# Patient Record
Sex: Female | Born: 1975 | Race: Black or African American | Hispanic: No | Marital: Single | Smoking: Former smoker
Health system: Southern US, Community
[De-identification: ages and names within clinical notes are randomized; demographics above are authoritative.]

## PROBLEM LIST (undated history)

## (undated) ENCOUNTER — Inpatient Hospital Stay (HOSPITAL_COMMUNITY): Payer: Self-pay

## (undated) DIAGNOSIS — I10 Essential (primary) hypertension: Secondary | ICD-10-CM

## (undated) DIAGNOSIS — R87629 Unspecified abnormal cytological findings in specimens from vagina: Secondary | ICD-10-CM

## (undated) DIAGNOSIS — M069 Rheumatoid arthritis, unspecified: Secondary | ICD-10-CM

## (undated) HISTORY — PX: DILATION AND CURETTAGE OF UTERUS: SHX78

## (undated) HISTORY — DX: Rheumatoid arthritis, unspecified: M06.9

## (undated) HISTORY — PX: CHOLECYSTECTOMY: SHX55

## (undated) HISTORY — PX: THERAPEUTIC ABORTION: SHX798

---

## 1997-06-19 ENCOUNTER — Inpatient Hospital Stay (HOSPITAL_COMMUNITY): Admission: AD | Admit: 1997-06-19 | Discharge: 1997-06-19 | Payer: Self-pay | Admitting: *Deleted

## 1997-06-25 ENCOUNTER — Inpatient Hospital Stay (HOSPITAL_COMMUNITY): Admission: AD | Admit: 1997-06-25 | Discharge: 1997-06-25 | Payer: Self-pay | Admitting: *Deleted

## 1997-07-21 ENCOUNTER — Inpatient Hospital Stay (HOSPITAL_COMMUNITY): Admission: AD | Admit: 1997-07-21 | Discharge: 1997-07-21 | Payer: Self-pay | Admitting: Obstetrics

## 1997-07-31 ENCOUNTER — Ambulatory Visit (HOSPITAL_COMMUNITY): Admission: RE | Admit: 1997-07-31 | Discharge: 1997-07-31 | Payer: Self-pay | Admitting: *Deleted

## 1997-09-04 ENCOUNTER — Inpatient Hospital Stay (HOSPITAL_COMMUNITY): Admission: AD | Admit: 1997-09-04 | Discharge: 1997-09-04 | Payer: Self-pay | Admitting: Obstetrics

## 1997-12-17 ENCOUNTER — Inpatient Hospital Stay (HOSPITAL_COMMUNITY): Admission: AD | Admit: 1997-12-17 | Discharge: 1997-12-17 | Payer: Self-pay | Admitting: *Deleted

## 1997-12-20 ENCOUNTER — Inpatient Hospital Stay (HOSPITAL_COMMUNITY): Admission: AD | Admit: 1997-12-20 | Discharge: 1997-12-22 | Payer: Self-pay | Admitting: Obstetrics

## 1998-07-16 ENCOUNTER — Inpatient Hospital Stay (HOSPITAL_COMMUNITY): Admission: AD | Admit: 1998-07-16 | Discharge: 1998-07-16 | Payer: Self-pay | Admitting: Obstetrics

## 1998-07-31 ENCOUNTER — Inpatient Hospital Stay (HOSPITAL_COMMUNITY): Admission: AD | Admit: 1998-07-31 | Discharge: 1998-07-31 | Payer: Self-pay | Admitting: Obstetrics

## 1998-10-21 ENCOUNTER — Other Ambulatory Visit: Admission: RE | Admit: 1998-10-21 | Discharge: 1998-10-21 | Payer: Self-pay | Admitting: Obstetrics & Gynecology

## 1998-10-21 ENCOUNTER — Other Ambulatory Visit: Admission: RE | Admit: 1998-10-21 | Discharge: 1998-10-21 | Payer: Self-pay | Admitting: Obstetrics

## 1998-10-21 ENCOUNTER — Encounter: Admission: RE | Admit: 1998-10-21 | Discharge: 1998-10-21 | Payer: Self-pay | Admitting: Obstetrics & Gynecology

## 1998-11-21 ENCOUNTER — Emergency Department (HOSPITAL_COMMUNITY): Admission: EM | Admit: 1998-11-21 | Discharge: 1998-11-21 | Payer: Self-pay | Admitting: Emergency Medicine

## 1999-01-02 ENCOUNTER — Encounter: Admission: RE | Admit: 1999-01-02 | Discharge: 1999-01-02 | Payer: Self-pay | Admitting: Obstetrics & Gynecology

## 1999-01-02 ENCOUNTER — Other Ambulatory Visit: Admission: RE | Admit: 1999-01-02 | Discharge: 1999-01-02 | Payer: Self-pay | Admitting: Obstetrics

## 1999-01-16 ENCOUNTER — Encounter: Admission: RE | Admit: 1999-01-16 | Discharge: 1999-01-16 | Payer: Self-pay | Admitting: Obstetrics & Gynecology

## 1999-03-07 ENCOUNTER — Inpatient Hospital Stay (HOSPITAL_COMMUNITY): Admission: AD | Admit: 1999-03-07 | Discharge: 1999-03-07 | Payer: Self-pay | Admitting: *Deleted

## 1999-03-09 ENCOUNTER — Inpatient Hospital Stay (HOSPITAL_COMMUNITY): Admission: AD | Admit: 1999-03-09 | Discharge: 1999-03-12 | Payer: Self-pay | Admitting: *Deleted

## 1999-03-09 ENCOUNTER — Encounter: Payer: Self-pay | Admitting: *Deleted

## 1999-03-10 ENCOUNTER — Encounter: Payer: Self-pay | Admitting: Obstetrics

## 1999-03-11 ENCOUNTER — Encounter: Payer: Self-pay | Admitting: *Deleted

## 1999-03-24 ENCOUNTER — Encounter: Admission: RE | Admit: 1999-03-24 | Discharge: 1999-03-24 | Payer: Self-pay | Admitting: Obstetrics & Gynecology

## 1999-08-27 ENCOUNTER — Inpatient Hospital Stay (HOSPITAL_COMMUNITY): Admission: AD | Admit: 1999-08-27 | Discharge: 1999-08-27 | Payer: Self-pay | Admitting: Obstetrics & Gynecology

## 2000-02-02 ENCOUNTER — Ambulatory Visit (HOSPITAL_COMMUNITY): Admission: RE | Admit: 2000-02-02 | Discharge: 2000-02-02 | Payer: Self-pay

## 2000-02-02 ENCOUNTER — Encounter: Admission: RE | Admit: 2000-02-02 | Discharge: 2000-02-02 | Payer: Self-pay

## 2000-03-28 ENCOUNTER — Emergency Department (HOSPITAL_COMMUNITY): Admission: EM | Admit: 2000-03-28 | Discharge: 2000-03-28 | Payer: Self-pay | Admitting: *Deleted

## 2000-04-09 ENCOUNTER — Inpatient Hospital Stay (HOSPITAL_COMMUNITY): Admission: AD | Admit: 2000-04-09 | Discharge: 2000-04-09 | Payer: Self-pay | Admitting: *Deleted

## 2000-04-19 ENCOUNTER — Encounter: Admission: RE | Admit: 2000-04-19 | Discharge: 2000-04-19 | Payer: Self-pay | Admitting: Obstetrics & Gynecology

## 2000-05-22 ENCOUNTER — Emergency Department (HOSPITAL_COMMUNITY): Admission: EM | Admit: 2000-05-22 | Discharge: 2000-05-22 | Payer: Self-pay | Admitting: *Deleted

## 2000-08-24 ENCOUNTER — Emergency Department (HOSPITAL_COMMUNITY): Admission: EM | Admit: 2000-08-24 | Discharge: 2000-08-24 | Payer: Self-pay | Admitting: Emergency Medicine

## 2000-08-25 ENCOUNTER — Inpatient Hospital Stay (HOSPITAL_COMMUNITY): Admission: AD | Admit: 2000-08-25 | Discharge: 2000-08-25 | Payer: Self-pay | Admitting: Obstetrics & Gynecology

## 2000-08-25 ENCOUNTER — Encounter: Payer: Self-pay | Admitting: Obstetrics & Gynecology

## 2000-09-21 ENCOUNTER — Emergency Department (HOSPITAL_COMMUNITY): Admission: EM | Admit: 2000-09-21 | Discharge: 2000-09-21 | Payer: Self-pay | Admitting: Emergency Medicine

## 2000-09-21 ENCOUNTER — Encounter: Payer: Self-pay | Admitting: Emergency Medicine

## 2001-01-31 ENCOUNTER — Inpatient Hospital Stay (HOSPITAL_COMMUNITY): Admission: AD | Admit: 2001-01-31 | Discharge: 2001-01-31 | Payer: Self-pay | Admitting: *Deleted

## 2001-01-31 ENCOUNTER — Encounter: Payer: Self-pay | Admitting: Obstetrics

## 2001-02-03 ENCOUNTER — Observation Stay (HOSPITAL_COMMUNITY): Admission: AD | Admit: 2001-02-03 | Discharge: 2001-02-03 | Payer: Self-pay | Admitting: Obstetrics

## 2001-02-03 ENCOUNTER — Encounter: Payer: Self-pay | Admitting: Obstetrics

## 2001-04-04 ENCOUNTER — Inpatient Hospital Stay (HOSPITAL_COMMUNITY): Admission: AD | Admit: 2001-04-04 | Discharge: 2001-04-05 | Payer: Self-pay | Admitting: Obstetrics

## 2001-04-22 ENCOUNTER — Emergency Department (HOSPITAL_COMMUNITY): Admission: EM | Admit: 2001-04-22 | Discharge: 2001-04-22 | Payer: Self-pay | Admitting: Emergency Medicine

## 2001-12-11 ENCOUNTER — Emergency Department (HOSPITAL_COMMUNITY): Admission: EM | Admit: 2001-12-11 | Discharge: 2001-12-11 | Payer: Self-pay | Admitting: Emergency Medicine

## 2002-01-09 ENCOUNTER — Inpatient Hospital Stay (HOSPITAL_COMMUNITY): Admission: AD | Admit: 2002-01-09 | Discharge: 2002-01-09 | Payer: Self-pay | Admitting: Obstetrics

## 2002-01-09 ENCOUNTER — Encounter: Payer: Self-pay | Admitting: Obstetrics

## 2002-04-03 ENCOUNTER — Inpatient Hospital Stay (HOSPITAL_COMMUNITY): Admission: AD | Admit: 2002-04-03 | Discharge: 2002-04-03 | Payer: Self-pay | Admitting: Obstetrics

## 2002-07-09 ENCOUNTER — Inpatient Hospital Stay (HOSPITAL_COMMUNITY): Admission: AD | Admit: 2002-07-09 | Discharge: 2002-07-09 | Payer: Self-pay | Admitting: Obstetrics

## 2002-12-24 ENCOUNTER — Inpatient Hospital Stay (HOSPITAL_COMMUNITY): Admission: AD | Admit: 2002-12-24 | Discharge: 2002-12-24 | Payer: Self-pay | Admitting: Obstetrics

## 2003-03-16 ENCOUNTER — Inpatient Hospital Stay (HOSPITAL_COMMUNITY): Admission: AD | Admit: 2003-03-16 | Discharge: 2003-03-16 | Payer: Self-pay | Admitting: Obstetrics

## 2003-04-12 ENCOUNTER — Emergency Department (HOSPITAL_COMMUNITY): Admission: AD | Admit: 2003-04-12 | Discharge: 2003-04-12 | Payer: Self-pay | Admitting: Family Medicine

## 2003-06-22 ENCOUNTER — Emergency Department (HOSPITAL_COMMUNITY): Admission: AD | Admit: 2003-06-22 | Discharge: 2003-06-22 | Payer: Self-pay | Admitting: Internal Medicine

## 2003-11-27 ENCOUNTER — Inpatient Hospital Stay (HOSPITAL_COMMUNITY): Admission: AD | Admit: 2003-11-27 | Discharge: 2003-11-27 | Payer: Self-pay | Admitting: Obstetrics

## 2003-11-28 ENCOUNTER — Inpatient Hospital Stay (HOSPITAL_COMMUNITY): Admission: RE | Admit: 2003-11-28 | Discharge: 2003-11-28 | Payer: Self-pay | Admitting: Obstetrics

## 2004-01-22 ENCOUNTER — Encounter (INDEPENDENT_AMBULATORY_CARE_PROVIDER_SITE_OTHER): Payer: Self-pay | Admitting: Specialist

## 2004-01-22 ENCOUNTER — Ambulatory Visit (HOSPITAL_COMMUNITY): Admission: RE | Admit: 2004-01-22 | Discharge: 2004-01-23 | Payer: Self-pay | Admitting: Surgery

## 2004-06-15 ENCOUNTER — Emergency Department (HOSPITAL_COMMUNITY): Admission: EM | Admit: 2004-06-15 | Discharge: 2004-06-15 | Payer: Self-pay | Admitting: Family Medicine

## 2004-08-09 ENCOUNTER — Emergency Department (HOSPITAL_COMMUNITY): Admission: EM | Admit: 2004-08-09 | Discharge: 2004-08-09 | Payer: Self-pay | Admitting: Emergency Medicine

## 2004-09-29 ENCOUNTER — Ambulatory Visit: Payer: Self-pay | Admitting: Internal Medicine

## 2004-10-24 ENCOUNTER — Emergency Department (HOSPITAL_COMMUNITY): Admission: EM | Admit: 2004-10-24 | Discharge: 2004-10-24 | Payer: Self-pay | Admitting: Family Medicine

## 2004-11-30 ENCOUNTER — Inpatient Hospital Stay (HOSPITAL_COMMUNITY): Admission: AD | Admit: 2004-11-30 | Discharge: 2004-11-30 | Payer: Self-pay | Admitting: Obstetrics

## 2005-02-22 ENCOUNTER — Emergency Department (HOSPITAL_COMMUNITY): Admission: EM | Admit: 2005-02-22 | Discharge: 2005-02-22 | Payer: Self-pay | Admitting: Family Medicine

## 2007-06-30 ENCOUNTER — Ambulatory Visit (HOSPITAL_COMMUNITY): Admission: RE | Admit: 2007-06-30 | Discharge: 2007-06-30 | Payer: Self-pay | Admitting: Obstetrics

## 2007-07-26 ENCOUNTER — Inpatient Hospital Stay (HOSPITAL_COMMUNITY): Admission: AD | Admit: 2007-07-26 | Discharge: 2007-07-26 | Payer: Self-pay | Admitting: Obstetrics

## 2007-08-10 ENCOUNTER — Ambulatory Visit (HOSPITAL_COMMUNITY): Admission: RE | Admit: 2007-08-10 | Discharge: 2007-08-10 | Payer: Self-pay | Admitting: Obstetrics

## 2007-11-19 ENCOUNTER — Inpatient Hospital Stay (HOSPITAL_COMMUNITY): Admission: AD | Admit: 2007-11-19 | Discharge: 2007-11-19 | Payer: Self-pay | Admitting: Obstetrics

## 2007-12-17 ENCOUNTER — Inpatient Hospital Stay (HOSPITAL_COMMUNITY): Admission: AD | Admit: 2007-12-17 | Discharge: 2007-12-19 | Payer: Self-pay | Admitting: Obstetrics

## 2008-05-16 ENCOUNTER — Emergency Department (HOSPITAL_COMMUNITY): Admission: EM | Admit: 2008-05-16 | Discharge: 2008-05-16 | Payer: Self-pay | Admitting: Family Medicine

## 2008-12-28 IMAGING — US US OB DETAIL+14 WK
1 series · 14 of 28 positions shown · non-contrast
Comparison: none

OBSTETRICAL ULTRASOUND:
 This ultrasound was performed in The [HOSPITAL], and the [facility] report will be stored to [REDACTED] PACS.

[Series 1: us ob detail+14 wk · 14 of 77 slices shown]
[im 3/77]
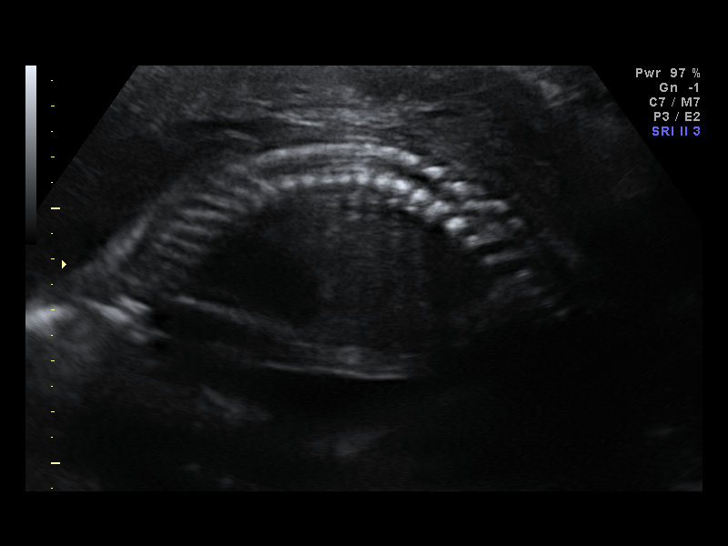
[im 9/77]
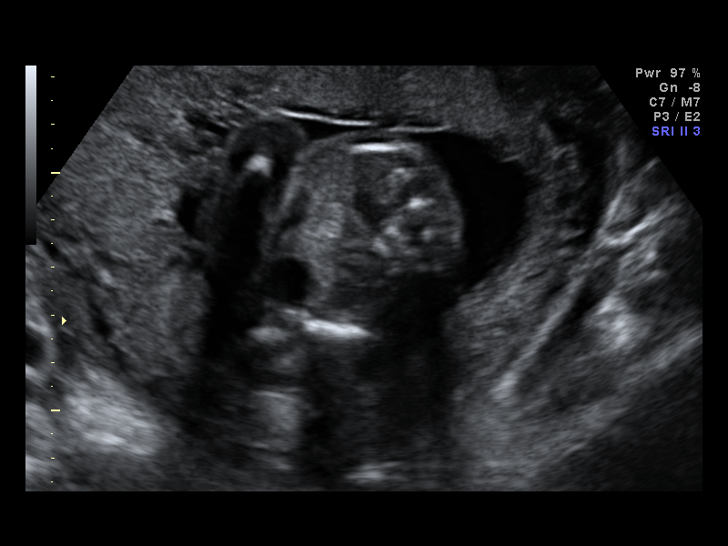
[im 15/77]
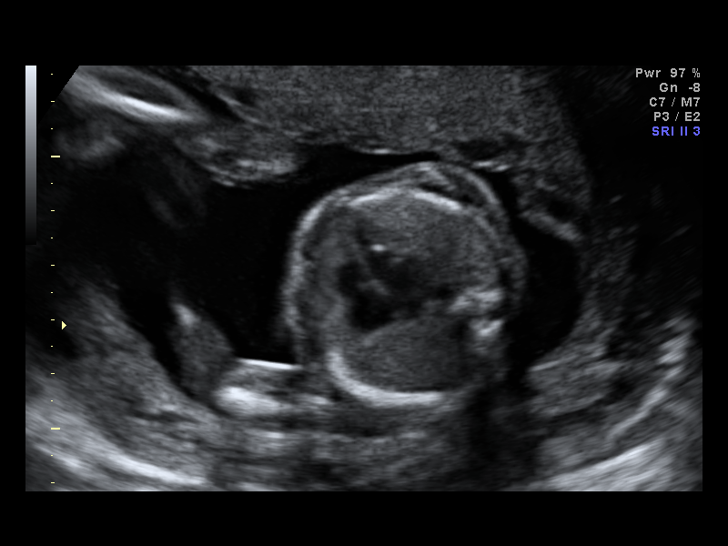
[im 20/77]
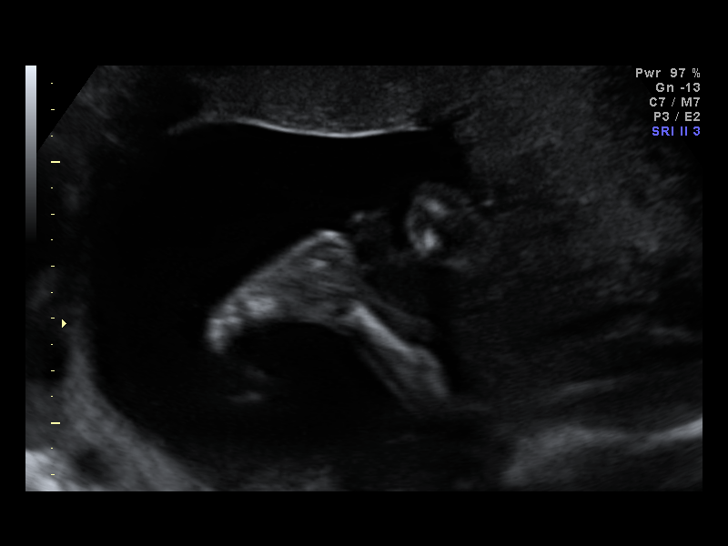
[im 26/77]
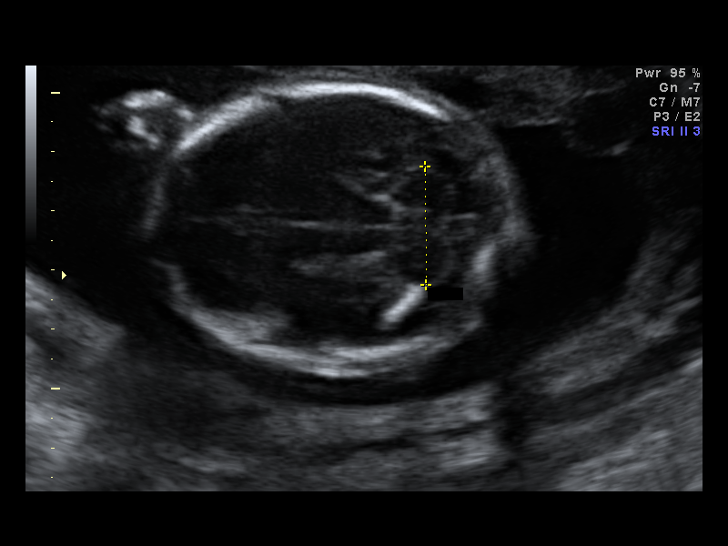
[im 31/77]
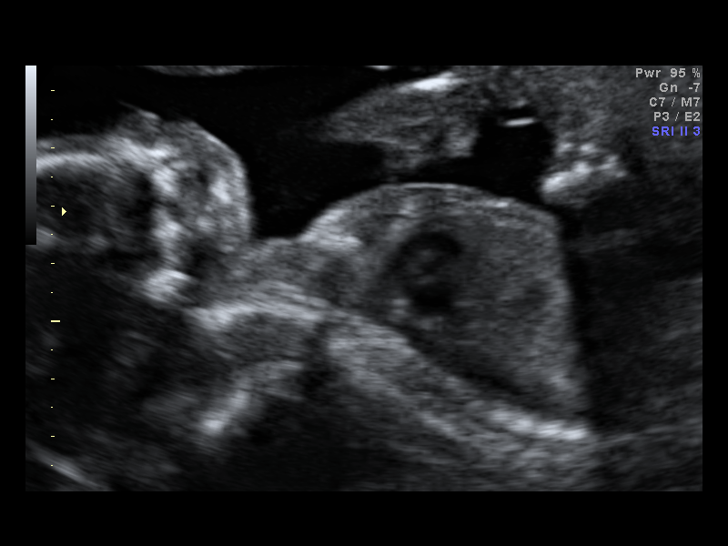
[im 37/77]
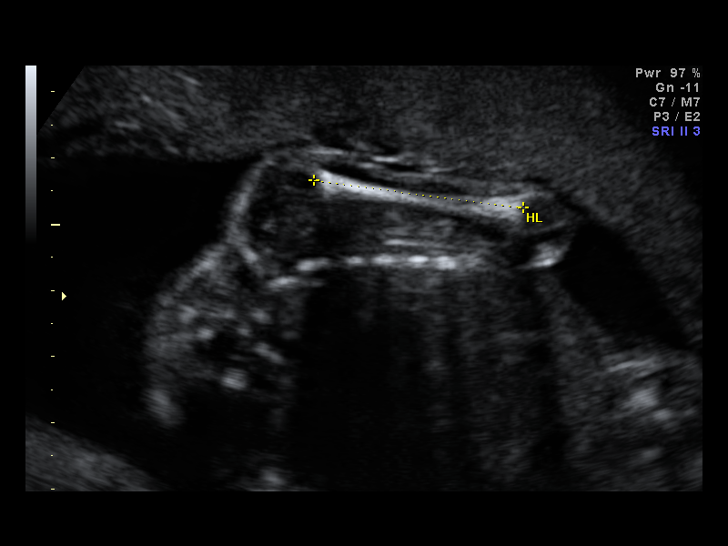
[im 43/77]
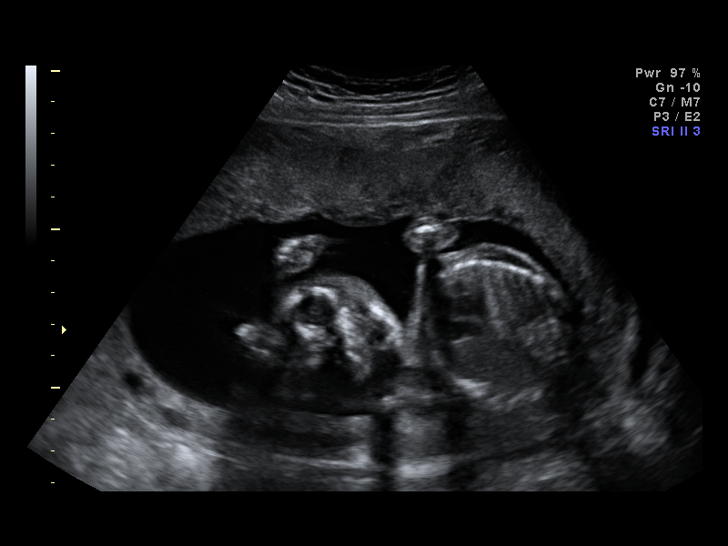
[im 48/77]
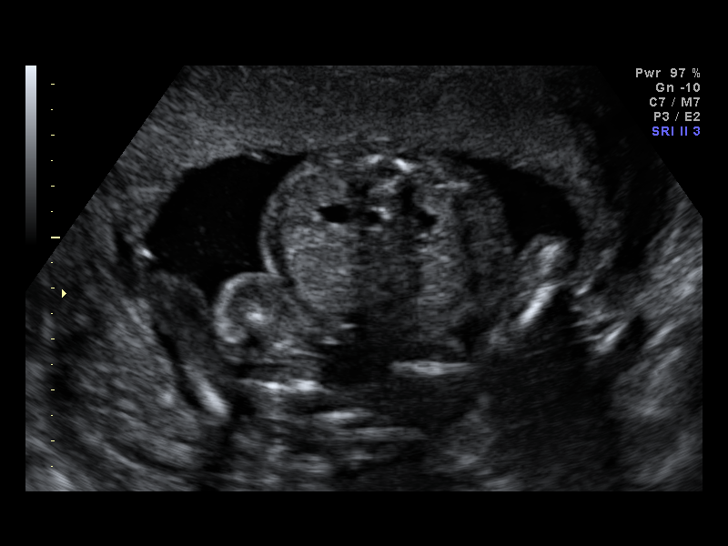
[im 54/77]
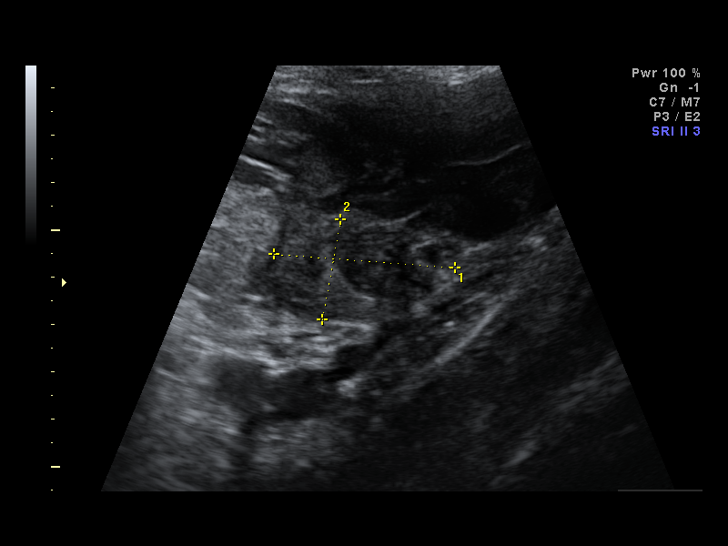
[im 60/77]
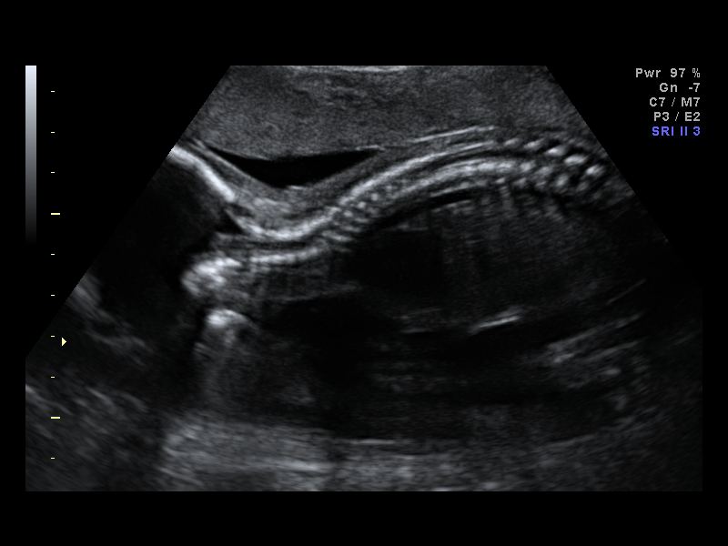
[im 65/77]
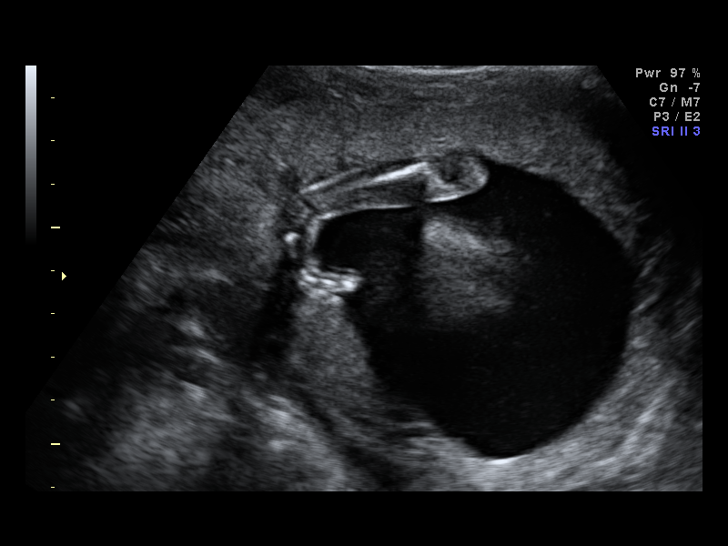
[im 71/77]
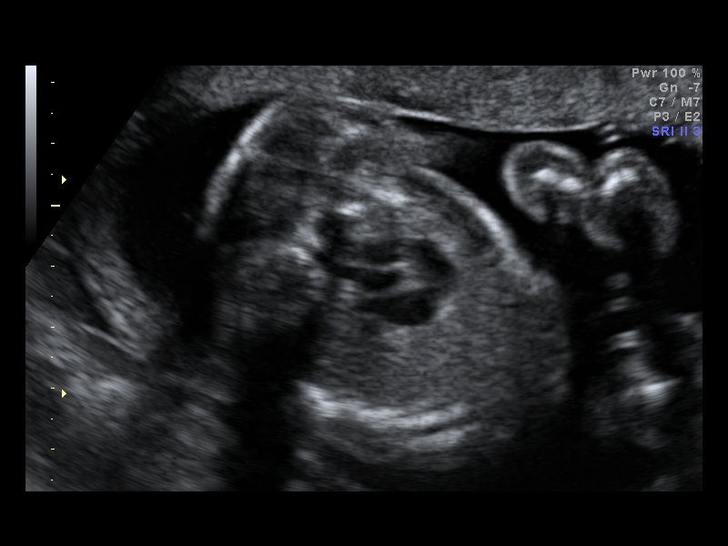
[im 77/77]
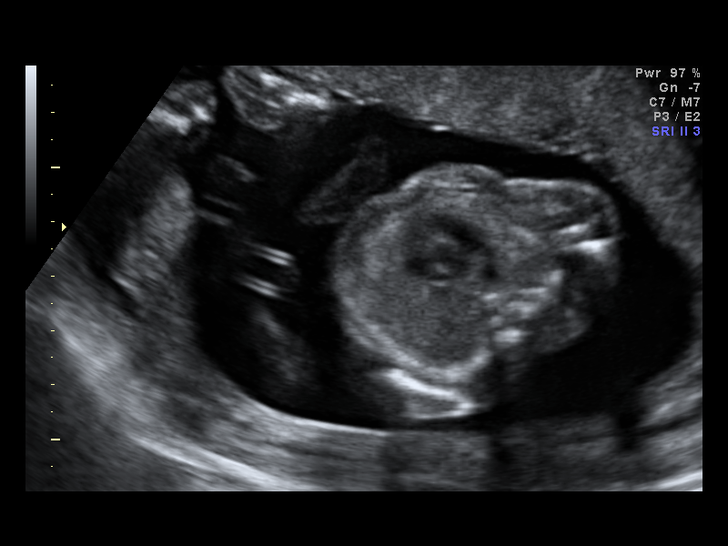

[14 of 28 positions shown; findings below may reference images not displayed]

IMPRESSION: [facility] has also been faxed to the ordering physician.

## 2009-02-25 ENCOUNTER — Emergency Department (HOSPITAL_COMMUNITY): Admission: EM | Admit: 2009-02-25 | Discharge: 2009-02-25 | Payer: Self-pay | Admitting: Emergency Medicine

## 2009-06-03 ENCOUNTER — Emergency Department (HOSPITAL_COMMUNITY): Admission: EM | Admit: 2009-06-03 | Discharge: 2009-06-04 | Payer: Self-pay | Admitting: Emergency Medicine

## 2009-07-15 ENCOUNTER — Emergency Department (HOSPITAL_COMMUNITY): Admission: EM | Admit: 2009-07-15 | Discharge: 2009-07-16 | Payer: Self-pay | Admitting: Emergency Medicine

## 2010-02-09 ENCOUNTER — Emergency Department (HOSPITAL_COMMUNITY): Admission: EM | Admit: 2010-02-09 | Discharge: 2010-02-09 | Payer: Self-pay | Admitting: Family Medicine

## 2010-05-04 ENCOUNTER — Encounter: Payer: Self-pay | Admitting: Obstetrics

## 2010-08-25 NOTE — H&P (Signed)
NAME:  Jill Bright, Jill Bright NO.:  [id number]   MEDICAL RECORD NO.:  [id number]          PATIENT TYPE:  INP   LOCATION:  9163                          FACILITY:  [facility]   PHYSICIAN:  Roseanna Rainbow, M.D.DATE OF BIRTH:  1976-01-16   DATE OF ADMISSION:  12/17/2007  DATE OF DISCHARGE:                              HISTORY & PHYSICAL   CHIEF COMPLAINT:  The patient is a 35 year old para 2 with an estimated  date of confinement of December 20, 2007 with an intrauterine pregnancy  at 39 plus weeks, complaining of contractions and bloody show.   HISTORY OF PRESENT ILLNESS:  Please see the above.   ALLERGIES:  No known drug allergies.   SOCIAL HISTORY:  She is single.  She denies any tobacco, ethanol or drug  use.   PAST GYN HISTORY:  Normal triad.  There is also a history of gonorrhea.   PAST OBSTETRICAL HISTORY:  In 1999, she has delivered of a live born  female, 7 pounds 4 ounces, spontaneous vaginal delivery and in 2002, she  has delivered of a live born female, 7 pounds 11 ounces, full term,  vaginal delivery.   PAST MEDICAL HISTORY:  She denies past surgical history.   She denies family history noncontributory.   PRENATAL LABORATORY DATA:  Hemoglobin 11.4, hematocrit 34, and platelets  206,000.  Blood type O positive and antibody screen negative.  Sickle  cell negative.  RPR nonreactive, rubella immune, hepatitis B surface  antigen negative, HIV nonreactive, and GC negative.  GBS negative on  November 23, 2007.  Antepartum course remarkable for treatment for urinary  tract infections and threatened preterm labor.   REVIEW OF SYSTEMS:  GU:  Please see the above.   PHYSICAL EXAMINATION:  Vital signs stable and afebrile.  Fetal heart  tracing reassuring.  Tocodynamometer, contractions every 2-4 minutes.  Sterile vaginal exam per the RN.   ASSESSMENT:  1. Multipara, at term.  2. Early labor.  3. Fetal heart tracing consistent with fetal well-being.   PLAN:   Admission, expectant management.       Roseanna Rainbow, M.D.  Electronically Signed     LAJ/MEDQ  D:  12/18/2007  T:  12/18/2007  Job:  [id number]   cc:   Kathreen Cosier, M.D.  Fax: [phone]

## 2010-08-28 NOTE — Op Note (Signed)
NAME:  Jill Bright, Jill Bright NO.:  [id number]   MEDICAL RECORD NO.:  [id number]          PATIENT TYPE:  OIB   LOCATION:  NA                           FACILITY:  [facility]   PHYSICIAN:  Thornton Park. Daphine Deutscher, M.D.DATE OF BIRTH:  [birth year]   DATE OF PROCEDURE:  01/22/2004  DATE OF DISCHARGE:                                 OPERATIVE REPORT   REFERRING PHYSICIAN:  Kathreen Cosier, M.D.   PREOPERATIVE DIAGNOSIS:  Chronic cholecystitis.   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   PROCEDURE:  Laparoscopic cholecystectomy.   SURGEON:  Thornton Park. Daphine Deutscher, M.D.   ASSISTANT:  Vikki Ports, M.D.   ANESTHESIA:  General endotracheal anesthesia.   DESCRIPTION OF PROCEDURE:  Jill Bright is a 35 year old black female  taken to room 16 on January 22, 2004, and given general anesthesia.  The  abdomen was prepped with Betadine and draped sterilely.  A longitudinal  incision was made down into the umbilicus and through this I entered the  abdomen and placed the applied medical Hasson.  The abdomen was insufflated,  found a vale of thin liquid adhesions in the upper abdomen occluding the  liver.  The liver had some vague spotty areas on the serosa.  It all  appeared to be serosal and inflammatory changes consistent with Curtis-Fitz-  Hugh syndrome.  These were visualized in the chart.  These were stripped  away revealing chronically inflamed gallbladder.  This was brought out and  contained multiple stones and the Calot's triangle was dissected free and  the critical view achieved.  Cystic duct was clamped and opened and a  Reddick catheter was inserted and a dynamic cholangiogram obtained which  showed good filling of the intrahepatic radicles and free-flowing of the  duodenum.  Cystic duct was triple clipped and divided.  Cystic arteries were  clipped and divided and the gallbladder was removed from the  gallbladder bed  without difficulty.  Hemostasis was achieved and no bowel leaks were noted.  It was placed in a bag and brought out through the umbilicus.  Umbilical  defect was  repaired with interrupted 0 Vicryls.  This was done with laparoscopic  vision.  The abdomen was deflated and the port sites were closed with 4-0  Vicryl.  The patient seemed to tolerate the procedure well and was taken to  the recovery room in satisfactory condition.       MBM/MEDQ  D:  01/22/2004  T:  01/22/2004  Job:  [id number]   cc:   Kathreen Cosier, M.D.  [address]  [place]  [place] [address]  Fax: [phone]

## 2011-01-05 LAB — URINALYSIS, ROUTINE W REFLEX MICROSCOPIC
Glucose, UA: NEGATIVE
Hgb urine dipstick: NEGATIVE
Ketones, ur: NEGATIVE
Protein, ur: NEGATIVE

## 2011-01-08 LAB — URINALYSIS, ROUTINE W REFLEX MICROSCOPIC
Bilirubin Urine: NEGATIVE
Glucose, UA: NEGATIVE
Ketones, ur: NEGATIVE
Nitrite: POSITIVE — AB
Specific Gravity, Urine: 1.015
pH: 6.5

## 2011-01-08 LAB — URINE MICROSCOPIC-ADD ON

## 2011-01-13 LAB — CBC
HCT: 29.1 — ABNORMAL LOW
Hemoglobin: 10.2 — ABNORMAL LOW
MCHC: 32.6
MCHC: 33.1
MCV: 94.3
RBC: 3.29 — ABNORMAL LOW
WBC: 10.7 — ABNORMAL HIGH

## 2012-01-30 ENCOUNTER — Inpatient Hospital Stay (HOSPITAL_COMMUNITY)
Admission: AD | Admit: 2012-01-30 | Discharge: 2012-01-30 | Disposition: A | Discharge status: Home / Self Care | Payer: Self-pay | Source: Ambulatory Visit | Attending: Obstetrics | Admitting: Obstetrics

## 2012-01-30 ENCOUNTER — Encounter (HOSPITAL_COMMUNITY): Payer: Self-pay | Admitting: *Deleted

## 2012-01-30 ENCOUNTER — Inpatient Hospital Stay (HOSPITAL_COMMUNITY): Payer: Self-pay

## 2012-01-30 DIAGNOSIS — Z3490 Encounter for supervision of normal pregnancy, unspecified, unspecified trimester: Secondary | ICD-10-CM

## 2012-01-30 DIAGNOSIS — R110 Nausea: Secondary | ICD-10-CM | POA: Insufficient documentation

## 2012-01-30 DIAGNOSIS — N831 Corpus luteum cyst of ovary, unspecified side: Secondary | ICD-10-CM | POA: Diagnosis present

## 2012-01-30 DIAGNOSIS — R51 Headache: Secondary | ICD-10-CM | POA: Insufficient documentation

## 2012-01-30 DIAGNOSIS — R1031 Right lower quadrant pain: Secondary | ICD-10-CM | POA: Insufficient documentation

## 2012-01-30 DIAGNOSIS — Z1389 Encounter for screening for other disorder: Secondary | ICD-10-CM

## 2012-01-30 DIAGNOSIS — B9689 Other specified bacterial agents as the cause of diseases classified elsewhere: Secondary | ICD-10-CM

## 2012-01-30 LAB — URINALYSIS, ROUTINE W REFLEX MICROSCOPIC
Glucose, UA: NEGATIVE mg/dL
Ketones, ur: 15 mg/dL — AB
Protein, ur: 100 mg/dL — AB
pH: 5.5 (ref 5.0–8.0)

## 2012-01-30 LAB — POCT PREGNANCY, URINE: Preg Test, Ur: POSITIVE — AB

## 2012-01-30 LAB — CBC
HCT: 33.1 % — ABNORMAL LOW (ref 36.0–46.0)
MCV: 88.7 fL (ref 78.0–100.0)
RBC: 3.73 MIL/uL — ABNORMAL LOW (ref 3.87–5.11)
WBC: 13.7 10*3/uL — ABNORMAL HIGH (ref 4.0–10.5)

## 2012-01-30 LAB — URINE MICROSCOPIC-ADD ON

## 2012-01-30 LAB — WET PREP, GENITAL: Yeast Wet Prep HPF POC: NONE SEEN

## 2012-01-30 LAB — ABO/RH: ABO/RH(D): O POS

## 2012-01-30 MED ORDER — OXYCODONE-ACETAMINOPHEN 5-325 MG PO TABS: 2.0000 | ORAL_TABLET | ORAL | Status: DC

## 2012-01-30 MED ORDER — ONDANSETRON 8 MG PO TBDP: 8.0000 mg | ORAL_TABLET | ORAL | Status: DC

## 2012-01-30 MED ORDER — ONDANSETRON 8 MG PO TBDP
8.0000 mg | ORAL_TABLET | ORAL | Status: AC
  Administered 2012-01-30: 8 mg via ORAL
  Filled 2012-01-30: qty 1

## 2012-01-30 MED ORDER — OXYCODONE-ACETAMINOPHEN 5-325 MG PO TABS
2.0000 | ORAL_TABLET | ORAL | Status: AC
  Administered 2012-01-30: 2 via ORAL
  Filled 2012-01-30: qty 2

## 2012-01-30 MED ORDER — METRONIDAZOLE 500 MG PO TABS: 500.0000 mg | ORAL_TABLET | Freq: Two times a day (BID) | ORAL | Status: DC

## 2012-01-30 NOTE — MAU Provider Note (Signed)
Chief Complaint: Abdominal Pain   First Provider Initiated Contact with Patient 01/30/12 2110     SUBJECTIVE HPI: Jill Bright is a 36 y.o. [id number] at [redacted]w[redacted]d by LMP who presents to maternity admissions reporting RLQ pain x3 days and n/v x2 days.  She rates her abdominal pain 10/10 and reports it has worsened today.  Patient's last menstrual period was 12/23/2011.  She denies vaginal bleeding, vaginal itching/burning, urinary symptoms, h/a, dizziness, or fever/chills.    After informing pt her pregnancy test is positive, she reports pregnancy is not desired and if it is normal pregnancy she plans to terminate.  History reviewed. No pertinent past medical history. Past Surgical History  Procedure Date  . Cholecystectomy    History   Social History  . Marital Status: Single    Spouse Name: N/A    Number of Children: N/A  . Years of Education: N/A   Occupational History  . Not on file.   Social History Main Topics  . Smoking status: Light Tobacco Smoker  . Smokeless tobacco: Not on file  . Alcohol Use: No  . Drug Use: No  . Sexually Active: Yes    Birth Control/ Protection: Condom   Other Topics Concern  . Not on file   Social History Narrative  . No narrative on file   No current facility-administered medications on file prior to encounter.   No current outpatient prescriptions on file prior to encounter.   Allergies  Allergen Reactions  . Tea Hives    ROS: Pertinent items in HPI  OBJECTIVE Blood pressure 109/69, pulse 103, temperature 98.3 F (36.8 C), temperature source Oral, resp. rate 18, height 5\' 6"  (1.676 m), weight 79.833 kg (176 lb), last menstrual period 12/23/2011, SpO2 100.00%. GENERAL: Well-developed, well-nourished female in no acute distress.  HEENT: Normocephalic HEART: normal rate RESP: normal effort ABDOMEN: Soft, non-tender EXTREMITIES: Nontender, no edema NEURO: Alert and oriented Pelvic exam: Cervix pink, visually closed, without  several small white patches 0.25cm in size, moderate amount white creamy discharge, vaginal walls and external genitalia normal Bimanual exam: Cervix 0/long/high, firm, posterior, neg CMT, uterus nontender, nonenlarged, adnexa with mild tenderness on right, none on left, no enlargement or mass bilaterally  LAB RESULTS Results for orders placed during the hospital encounter of 01/30/12 (from the past 24 hour(s))  URINALYSIS, ROUTINE W REFLEX MICROSCOPIC     Status: Abnormal   Collection Time   01/30/12  8:44 PM      Component Value Range   Color, Urine ORANGE (*) YELLOW   APPearance CLEAR  CLEAR   Specific Gravity, Urine 1.020  1.005 - 1.030   pH 5.5  5.0 - 8.0   Glucose, UA NEGATIVE  NEGATIVE mg/dL   Hgb urine dipstick TRACE (*) NEGATIVE   Bilirubin Urine MODERATE (*) NEGATIVE   Ketones, ur 15 (*) NEGATIVE mg/dL   Protein, ur 147 (*) NEGATIVE mg/dL   Urobilinogen, UA 2.0 (*) 0.0 - 1.0 mg/dL   Nitrite NEGATIVE  NEGATIVE   Leukocytes, UA TRACE (*) NEGATIVE  URINE MICROSCOPIC-ADD ON     Status: Abnormal   Collection Time   01/30/12  8:44 PM      Component Value Range   Squamous Epithelial / LPF FEW (*) RARE   WBC, UA 7-10  <3 WBC/hpf   RBC / HPF 7-10  <3 RBC/hpf   Bacteria, UA FEW (*) RARE  POCT PREGNANCY, URINE     Status: Abnormal   Collection Time   01/30/12  8:50 PM      Component Value Range   Preg Test, Ur POSITIVE (*) NEGATIVE  CBC     Status: Abnormal   Collection Time   01/30/12  9:09 PM      Component Value Range   WBC 13.7 (*) 4.0 - 10.5 K/uL   RBC 3.73 (*) 3.87 - 5.11 MIL/uL   Hemoglobin 11.0 (*) 12.0 - 15.0 g/dL   HCT 95.6 (*) 21.3 - 08.6 %   MCV 88.7  78.0 - 100.0 fL   MCH 29.5  26.0 - 34.0 pg   MCHC 33.2  30.0 - 36.0 g/dL   RDW 57.8  46.9 - 62.9 %   Platelets 157  150 - 400 K/uL  ABO/RH     Status: Normal   Collection Time   01/30/12  9:09 PM      Component Value Range   ABO/RH(D) O POS    HCG, QUANTITATIVE, PREGNANCY     Status: Abnormal   Collection  Time   01/30/12  9:09 PM      Component Value Range   hCG, Beta Chain, Sharene Butters, [place] [address] (*) <5 mIU/mL  WET PREP, GENITAL     Status: Abnormal   Collection Time   01/30/12  9:19 PM      Component Value Range   Yeast Wet Prep HPF POC NONE SEEN  NONE SEEN   Trich, Wet Prep NONE SEEN  NONE SEEN   Clue Cells Wet Prep HPF POC MODERATE (*) NONE SEEN   WBC, Wet Prep HPF POC MODERATE (*) NONE SEEN    IMAGING US Ob Comp Less 14 Wks  01/30/2012  *RADIOLOGY REPORT*  Clinical Data: Right lower quadrant pain, early pregnancy  OBSTETRIC <14 WK ULTRASOUND  Technique:  Transabdominal ultrasound was performed for evaluation of the gestation as well as the maternal uterus and adnexal regions.  Comparison:  None.  Intrauterine gestational sac: Visualized/normal in shape. Yolk sac: Identified Embryo: Identified Cardiac Activity: Identified Heart Rate: 113 bpm  CRL:  3.8 mm  6 w  1 d        [place] EDC: 09/23/2012  Maternal uterus/Adnexae: Small subchorionic hemorrhage.  Normal sonographic appearance to the ovaries.  Corpus luteal cyst noted on the right.  No free fluid.  IMPRESSION: Single intrauterine gestation with cardiac activity documented. Estimated age of 6 weeks 1 day by crown-rump length.  Small subchorionic hemorrhage.   Original Report Authenticated By: Waneta Martins, M.D.     ASSESSMENT 1. Normal IUP (intrauterine pregnancy) on prenatal ultrasound   2. Corpus luteum cyst   3. Bacterial vaginosis     PLAN Discharge home Flagyl 500 mg BID x7 days FU with prenatal care if desired Return to MAU as needed    Medication List     As of 02/07/2012  9:02 PM    TAKE these medications         metroNIDAZOLE 500 MG tablet   Commonly known as: FLAGYL   Take 1 tablet (500 mg total) by mouth 2 (two) times daily.      naproxen sodium 220 MG tablet   Commonly known as: ANAPROX   Take 440 mg by mouth every 3 (three) hours as needed. pain      ondansetron 8 MG disintegrating tablet   Commonly known  as: ZOFRAN-ODT   Take 1 tablet (8 mg total) by mouth stat.      oxyCODONE-acetaminophen 5-325 MG per tablet   Commonly known as: PERCOCET/ROXICET  Take 2 tablets by mouth stat.           Sharen Counter Certified Nurse-Midwife 01/30/2012  9:22 PM

## 2012-01-30 NOTE — MAU Note (Signed)
Pt reports "i am in a lot of pain" states it started as off/on pain on rt lower abd 2 days ago and now it is constant in rt lower abd. Nausea and headache since yesterday. Denies dysuria. LMP 12/23/2011, has not had a preg test. Condoms for birth control.

## 2012-01-31 LAB — GC/CHLAMYDIA PROBE AMP, GENITAL: Chlamydia, DNA Probe: NEGATIVE

## 2012-02-02 LAB — URINE CULTURE: Colony Count: >=100000

## 2012-02-07 ENCOUNTER — Other Ambulatory Visit: Payer: Self-pay | Admitting: Advanced Practice Midwife

## 2012-02-07 MED ORDER — NITROFURANTOIN MONOHYD MACRO 100 MG PO CAPS: 100.0000 mg | ORAL_CAPSULE | Freq: Two times a day (BID) | ORAL | Status: DC

## 2012-02-07 NOTE — Progress Notes (Signed)
Called pt and informed pt that her urine results came back that she has UTI and that Macrobid was prescribed for twice a day for 7 days at her [facility] pharmacy on [address].  Pt stated understanding and did not have any further questions.

## 2012-02-07 NOTE — Progress Notes (Signed)
Urine culture in MAU on 01/30/12 positive for infection.  Macrobid BID x7 days sent to [facility] on [address].  Please call pt to let her know antibiotics are prescribed.  Thank you.

## 2012-12-04 ENCOUNTER — Encounter (HOSPITAL_COMMUNITY): Payer: Self-pay | Admitting: *Deleted

## 2013-01-24 ENCOUNTER — Encounter (HOSPITAL_COMMUNITY): Payer: Self-pay | Admitting: Emergency Medicine

## 2013-01-24 ENCOUNTER — Emergency Department (INDEPENDENT_AMBULATORY_CARE_PROVIDER_SITE_OTHER)
Admission: EM | Admit: 2013-01-24 | Discharge: 2013-01-24 | Disposition: A | Discharge status: Home / Self Care | Payer: Self-pay | Source: Home / Self Care | Attending: Family Medicine | Admitting: Family Medicine

## 2013-01-24 DIAGNOSIS — A084 Viral intestinal infection, unspecified: Secondary | ICD-10-CM

## 2013-01-24 DIAGNOSIS — A088 Other specified intestinal infections: Secondary | ICD-10-CM

## 2013-01-24 MED ORDER — ONDANSETRON HCL 4 MG/5ML PO SOLN
ORAL | Status: AC
  Filled 2013-01-24: qty 5

## 2013-01-24 MED ORDER — PROMETHAZINE HCL 25 MG PO TABS: 25.0000 mg | ORAL_TABLET | Freq: Four times a day (QID) | ORAL | Status: DC | PRN

## 2013-01-24 MED ORDER — ONDANSETRON 4 MG PO TBDP
8.0000 mg | ORAL_TABLET | Freq: Once | ORAL | Status: AC
  Administered 2013-01-24: 8 mg via ORAL

## 2013-01-24 NOTE — ED Provider Notes (Signed)
Jill Bright is a 37 y.o. female who presents to Urgent Care today for nausea vomiting diarrhea and abdominal cramping starting yesterday. She's had sick contacts with her children have been diagnosed with viral gastroenteritis. She denies any blood in her stool or vomit. She is unable to keep much food down currently. She is still urinating. She was sent home from work today when she vomited.  She feels well otherwise without any fevers or chills.    No past medical history on file. History  Substance Use Topics  . Smoking status: Light Tobacco Smoker  . Smokeless tobacco: Not on file  . Alcohol Use: No   ROS as above Medications reviewed. Current Facility-Administered Medications  Medication Dose Route Frequency Provider Last Rate Last Dose  . ondansetron (ZOFRAN-ODT) disintegrating tablet 8 mg  8 mg Oral Once Rodolph Bong, MD       Current Outpatient Prescriptions  Medication Sig Dispense Refill  . promethazine (PHENERGAN) 25 MG tablet Take 1 tablet (25 mg total) by mouth every 6 (six) hours as needed for nausea.  30 tablet  0    Exam:  BP 143/94  Pulse 66  Temp(Src) 98 F (36.7 C) (Oral)  Resp 18 Gen: Well NAD HEENT: EOMI,  MMM Lungs: CTABL Nl WOB Heart: RRR no MRG Abd: NABS, NT, ND, no rebound or guarding Exts: Non edematous BL  LE, warm and well perfused.   No results found for this or any previous visit (from the past 24 hour(s)). No results found.  Assessment and Plan: 37 y.o. female with viral gastroenteritis. Plan he had Zofran in the clinic today and prescribe Phenergan. Followup if not improving Discussed warning signs or symptoms. Please see discharge instructions. Patient expresses understanding. Work note provided     Rodolph Bong, MD 01/24/13 [phone]

## 2013-01-24 NOTE — ED Notes (Signed)
Abdominal cramping, diarrhea, vomting

## 2013-06-28 ENCOUNTER — Emergency Department (INDEPENDENT_AMBULATORY_CARE_PROVIDER_SITE_OTHER)
Admission: EM | Admit: 2013-06-28 | Discharge: 2013-06-28 | Disposition: A | Discharge status: Home / Self Care | Payer: Self-pay | Source: Home / Self Care

## 2013-06-28 DIAGNOSIS — R42 Dizziness and giddiness: Secondary | ICD-10-CM

## 2013-06-28 DIAGNOSIS — H6591 Unspecified nonsuppurative otitis media, right ear: Secondary | ICD-10-CM

## 2013-06-28 DIAGNOSIS — H9209 Otalgia, unspecified ear: Secondary | ICD-10-CM

## 2013-06-28 DIAGNOSIS — H9201 Otalgia, right ear: Secondary | ICD-10-CM

## 2013-06-28 DIAGNOSIS — H6590 Unspecified nonsuppurative otitis media, unspecified ear: Secondary | ICD-10-CM

## 2013-06-28 MED ORDER — AMOXICILLIN-POT CLAVULANATE 875-125 MG PO TABS: 1.0000 | ORAL_TABLET | Freq: Two times a day (BID) | ORAL | Status: DC

## 2013-06-28 MED ORDER — HYDROCODONE-ACETAMINOPHEN 5-325 MG PO TABS: 1.0000 | ORAL_TABLET | ORAL | Status: DC | PRN

## 2013-06-28 MED ORDER — MECLIZINE HCL 25 MG PO TABS: 25.0000 mg | ORAL_TABLET | Freq: Three times a day (TID) | ORAL | Status: DC | PRN

## 2013-06-28 NOTE — ED Provider Notes (Signed)
Medical screening examination/treatment/procedure(s) were performed by non-physician practitioner and as supervising physician I was immediately available for consultation/collaboration.  Philipp Deputy, M.D.  Harden Mo, MD 06/28/13 [phone]

## 2013-06-28 NOTE — ED Provider Notes (Signed)
CSN: [id number]     Arrival date & time 06/28/13  1001 History   First MD Initiated Contact with Patient 06/28/13 1053     Chief Complaint  Patient presents with  . Ear Drainage   (Consider location/radiation/quality/duration/timing/severity/associated sxs/prior Treatment) HPI Comments: 38-year-old female who is also an employee of [employer] developed pain in the right ear yesterday. The discomfort radiates into the right face. Following the onset of pain she noticed a constant drainage of amber to white fluid/exudate. Today she also has dizziness that she compares to motion sickness as after getting off a carnival ride. Denies vertigo.   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 female presenting with ear drainage.  Ear Drainage Pertinent negatives include no abdominal pain.    No past medical history on file. Past Surgical History  Procedure Laterality Date  . Cholecystectomy     No family history on file. History  Substance Use Topics  . Smoking status: Light Tobacco Smoker  . Smokeless tobacco: Not on file  . Alcohol Use: No   OB History   Grav Para Term Preterm Abortions TAB SAB Ect Mult Living   6 3 3  2 1 1   3      Review of Systems  Constitutional: Positive for activity change. Negative for fever and fatigue.  HENT: Positive for ear discharge and ear pain. Negative for congestion, postnasal drip, rhinorrhea, sore throat and trouble swallowing.   Gastrointestinal: Negative for nausea, vomiting and abdominal pain.  All other systems reviewed and are negative.    Allergies  Tea  Home Medications   Current Outpatient Rx  Name  Route  Sig  Dispense  Refill  . amoxicillin-clavulanate (AUGMENTIN) 875-125 MG per tablet   Oral   Take 1 tablet by mouth every 12 (twelve) hours.   20 tablet   0   . HYDROcodone-acetaminophen (NORCO/VICODIN) 5-325 MG per tablet   Oral   Take 1 tablet by mouth every 4  (four) hours as needed.   15 tablet   0   . meclizine (ANTIVERT) 25 MG tablet   Oral   Take 1 tablet (25 mg total) by mouth 3 (three) times daily as needed for dizziness.   20 tablet   0   . promethazine (PHENERGAN) 25 MG tablet   Oral   Take 1 tablet (25 mg total) by mouth every 6 (six) hours as needed for nausea.   30 tablet   0    BP 122/79  Pulse 75  Temp(Src) 98.5 F (36.9 C) (Oral)  Resp 16  SpO2 100% Physical Exam  Nursing note and vitals reviewed. Constitutional: She is oriented to person, place, and time. She appears well-developed and well-nourished. No distress.  HENT:  Head: Normocephalic and atraumatic.  Left Ear: External ear normal.  Mouth/Throat: Oropharynx is clear and moist. No oropharyngeal exudate.  Left TM and EAC normal. Right EAC is filled with white exudate draining from the canal. The TM is not visible on initial examination. Post irrigation the EAC is clear and without swelling or erythema. The exudate is originating from the middle ear.  TM discolored, whitish tan with bulging in dependent area. Large effusion.   Eyes: Conjunctivae are normal.  Neck: Normal range of motion. Neck supple.  Cardiovascular: Normal rate.   Pulmonary/Chest: Effort normal. No respiratory distress.  Lymphadenopathy:    She has no cervical adenopathy.  Neurological: She is alert and  oriented to person, place, and time. She exhibits normal muscle tone.  Skin: Skin is warm and dry. No rash noted.  No facial erythema or swelling. Mild preauricular tenderness. No local lymphadenitis.  Psychiatric: She has a normal mood and affect.    ED Course  Procedures (including critical care time) Labs Review Labs Reviewed - No data to display Imaging Review No results found.   MDM   1. Right otitis media with effusion   2. Otalgia of right ear   3. Dizziness      Irrigation R ear. Meclizine for dizziness augmentin bid x 10 d norco 5 mg #15 F/U with  ENT.    Janne Napoleon, NP 06/28/13 [phone]

## 2013-06-28 NOTE — Discharge Instructions (Signed)
Draining Ear Ear wax, pus, blood and other fluids are examples of the different types of drainage from ears. Drops or cream may be needed to lessen the itching which may occur with ear drainage. CAUSES   Skin irritations in the ear.  Ear infection.  Swimmer's ear.  Ruptured eardrum.  Foreign object in the ear canal.  Sudden pressure changes.  Head injury. HOME CARE INSTRUCTIONS   Only take over-the-counter or prescription medicines for pain, fever, or discomfort as directed by your caregiver.  Do not rub the ear canal with cotton-tipped swabs.  Do not swim until your caregiver says it is okay.  Before you take a shower, cover a cotton ball with petroleum jelly to keep water out.  Limit exposure to smoke. Secondhand smoke can increase the chance for ear infections.  Keep up with immunizations.  Wash your hands well.  Keep all follow-up appointments to examine the ear and evaluate hearing. SEEK MEDICAL CARE IF:   You have increased drainage.  You have ear pain, a fever, or drainage that is not getting better after 48 hours of antibiotics.  You are unusually tired. SEEK IMMEDIATE MEDICAL CARE IF:  You have severe ear pain or headache.  The patient is older than 3 months with a rectal or oral temperature of 102 F (38.9 C) or higher.  The patient is 73 months old or younger with a rectal temperature of 100.4 F (38 C) or higher.  You vomit.  You feel dizzy.  You have a seizure.  You have new hearing loss. MAKE SURE YOU:   Understand these instructions.  Will watch your condition.  Will get help right away if you are not doing well or get worse. Document Released: 03/29/2005 Document Revised: 06/21/2011 Document Reviewed: 01/30/2009 [facility] Patient Information 2014 [place], [place].  Otitis Media, Adult Otitis media is redness, soreness, and swelling (inflammation) of the middle ear. Otitis media may be caused by allergies or, most commonly, by  infection. Often it occurs as a complication of the common cold. SIGNS AND SYMPTOMS Symptoms of otitis media may include:  Earache.  Fever.  Ringing in your ear.  Headache.  Leakage of fluid from the ear. DIAGNOSIS To diagnose otitis media, your health care provider will examine your ear with an otoscope. This is an instrument that allows your health care provider to see into your ear in order to examine your eardrum. Your health care provider also will ask you questions about your symptoms. TREATMENT  Typically, otitis media resolves on its own within 3 5 days. Your health care provider may prescribe medicine to ease your symptoms of pain. If otitis media does not resolve within 5 days or is recurrent, your health care provider may prescribe antibiotic medicines if he or she suspects that a bacterial infection is the cause. HOME CARE INSTRUCTIONS   Take your medicine as directed until it is gone, even if you feel better after the first few days.  Only take over-the-counter or prescription medicines for pain, discomfort, or fever as directed by your health care provider.  Follow up with your health care provider as directed. SEEK MEDICAL CARE IF:  You have otitis media only in one ear or bleeding from your nose or both.  You notice a lump on your neck.  You are not getting better in 3 5 days.  You feel worse instead of better. SEEK IMMEDIATE MEDICAL CARE IF:   You have pain that is not controlled with medicine.  You have  swelling, redness, or pain around your ear or stiffness in your neck.  You notice that part of your face is paralyzed.  You notice that the bone behind your ear (mastoid) is tender when you touch it. MAKE SURE YOU:   Understand these instructions.  Will watch your condition.  Will get help right away if you are not doing well or get worse. Document Released: 01/02/2004 Document Revised: 01/17/2013 Document Reviewed: 10/24/2012 [facility] Patient  Information 2014 [place], [place].  Otalgia The most common reason for this in children is an infection of the middle ear. Pain from the middle ear is usually caused by a build-up of fluid and pressure behind the eardrum. Pain from an earache can be sharp, dull, or burning. The pain may be temporary or constant. The middle ear is connected to the nasal passages by a short narrow tube called the Eustachian tube. The Eustachian tube allows fluid to drain out of the middle ear, and helps keep the pressure in your ear equalized. CAUSES  A cold or allergy can block the Eustachian tube with inflammation and the build-up of secretions. This is especially likely in small children, because their Eustachian tube is shorter and more horizontal. When the Eustachian tube closes, the normal flow of fluid from the middle ear is stopped. Fluid can accumulate and cause stuffiness, pain, hearing loss, and an ear infection if germs start growing in this area. SYMPTOMS  The symptoms of an ear infection may include fever, ear pain, fussiness, increased crying, and irritability. Many children will have temporary and minor hearing loss during and right after an ear infection. Permanent hearing loss is rare, but the risk increases the more infections a child has. Other causes of ear pain include retained water in the outer ear canal from swimming and bathing. Ear pain in adults is less likely to be from an ear infection. Ear pain may be referred from other locations. Referred pain may be from the joint between your jaw and the skull. It may also come from a tooth problem or problems in the neck. Other causes of ear pain include:  A foreign body in the ear.  Outer ear infection.  Sinus infections.  Impacted ear wax.  Ear injury.  Arthritis of the jaw or TMJ problems.  Middle ear infection.  Tooth infections.  Sore throat with pain to the ears. DIAGNOSIS  Your caregiver can usually make the diagnosis by examining  you. Sometimes other special studies, including x-rays and lab work may be necessary. TREATMENT   If antibiotics were prescribed, use them as directed and finish them even if you or your child's symptoms seem to be improved.  Sometimes PE tubes are needed in children. These are little plastic tubes which are put into the eardrum during a simple surgical procedure. They allow fluid to drain easier and allow the pressure in the middle ear to equalize. This helps relieve the ear pain caused by pressure changes. HOME CARE INSTRUCTIONS   Only take over-the-counter or prescription medicines for pain, discomfort, or fever as directed by your caregiver. DO NOT GIVE CHILDREN ASPIRIN because of the association of Reye's Syndrome in children taking aspirin.  Use a cold pack applied to the outer ear for 15-20 minutes, 03-04 times per day or as needed may reduce pain. Do not apply ice directly to the skin. You may cause frost bite.  Over-the-counter ear drops used as directed may be effective. Your caregiver may sometimes prescribe ear drops.  Resting in an  upright position may help reduce pressure in the middle ear and relieve pain.  Ear pain caused by rapidly descending from high altitudes can be relieved by swallowing or chewing gum. Allowing infants to suck on a bottle during airplane travel can help.  Do not smoke in the house or near children. If you are unable to quit smoking, smoke outside.  Control allergies. SEEK IMMEDIATE MEDICAL CARE IF:   You or your child are becoming sicker.  Pain or fever relief is not obtained with medicine.  You or your child's symptoms (pain, fever, or irritability) do not improve within 24 to 48 hours or as instructed.  Severe pain suddenly stops hurting. This may indicate a ruptured eardrum.  You or your children develop new problems such as severe headaches, stiff neck, difficulty swallowing, or swelling of the face or around the ear. Document Released:  11/14/2003 Document Revised: 06/21/2011 Document Reviewed: 03/20/2008 [facility] Patient Information 2014 [place].  Serous Otitis Media  Serous otitis media is fluid in the middle ear space. This space contains the bones for hearing and air. Air in the middle ear space helps to transmit sound.  The air gets there through the eustachian tube. This tube goes from the back of the nose (nasopharynx) to the middle ear space. It keeps the pressure in the middle ear the same as the outside world. It also helps to drain fluid from the middle ear space. CAUSES  Serous otitis media occurs when the eustachian tube gets blocked. Blockage can come from:  Ear infections.  Colds and other upper respiratory infections.  Allergies.  Irritants such as cigarette smoke.  Sudden changes in air pressure (such as descending in an airplane).  Enlarged adenoids.  A mass in the nasopharynx. During colds and upper respiratory infections, the middle ear space can become temporarily filled with fluid. This can happen after an ear infection also. Once the infection clears, the fluid will generally drain out of the ear through the eustachian tube. If it does not, then serous otitis media occurs. SIGNS AND SYMPTOMS   Hearing loss.  A feeling of fullness in the ear, without pain.  Young children may not show any symptoms but may show slight behavioral changes, such as agitation, ear pulling, or crying. DIAGNOSIS  Serous otitis media is diagnosed by an ear exam. Tests may be done to check on the movement of the eardrum. Hearing exams may also be done. TREATMENT  The fluid most often goes away without treatment. If allergy is the cause, allergy treatment may be helpful. Fluid that persists for several months may require minor surgery. A small tube is placed in the eardrum to:  Drain the fluid.  Restore the air in the middle ear space. In certain situations, antibiotics are used to avoid surgery. Surgery may  be done to remove enlarged adenoids (if this is the cause). HOME CARE INSTRUCTIONS   Keep children away from tobacco smoke.  Be sure to keep any follow-up appointments. SEEK MEDICAL CARE IF:   Your hearing is not better in 3 months.  Your hearing is worse.  You have ear pain.  You have drainage from the ear.  You have dizziness.  You have serous otitis media only in one ear or have any bleeding from your nose (epistaxis).  You notice a lump on your neck. MAKE SURE YOU:  Understand these instructions.   Will watch your condition.   Will get help right away if you are not doing well or  get worse.  Document Released: 06/19/2003 Document Revised: 11/29/2012 Document Reviewed: 10/24/2012 [facility] Patient Information 2014 [place], [place].  Dizziness Dizziness is a common problem. It is a feeling of unsteadiness or lightheadedness. You may feel like you are about to faint. Dizziness can lead to injury if you stumble or fall. A person of any age group can suffer from dizziness, but dizziness is more common in older adults. CAUSES  Dizziness can be caused by many different things, including:  Middle ear problems.  Standing for too long.  Infections.  An allergic reaction.  Aging.  An emotional response to something, such as the sight of blood.  Side effects of medicines.  Fatigue.  Problems with circulation or blood pressure.  Excess use of alcohol, medicines, or illegal drug use.  Breathing too fast (hyperventilation).  An arrhythmia or problems with your heart rhythm.  Low red blood cell count (anemia).  Pregnancy.  Vomiting, diarrhea, fever, or other illnesses that cause dehydration.  Diseases or conditions such as Parkinson's disease, high blood pressure (hypertension), diabetes, and thyroid problems.  Exposure to extreme heat. DIAGNOSIS  To find the cause of your dizziness, your caregiver may do a physical exam, lab tests, radiologic imaging scans,  or an electrocardiography test (ECG).  TREATMENT  Treatment of dizziness depends on the cause of your symptoms and can vary greatly. HOME CARE INSTRUCTIONS   Drink enough fluids to keep your urine clear or pale yellow. This is especially important in very hot weather. In the elderly, it is also important in cold weather.  If your dizziness is caused by medicines, take them exactly as directed. When taking blood pressure medicines, it is especially important to get up slowly.  Rise slowly from chairs and steady yourself until you feel okay.  In the morning, first sit up on the side of the bed. When this seems okay, stand slowly while holding onto something until you know your balance is fine.  If you need to stand in one place for a long time, be sure to move your legs often. Tighten and relax the muscles in your legs while standing.  If dizziness continues to be a problem, have someone stay with you for a day or two. Do this until you feel you are well enough to stay alone. Have the person call your caregiver if he or she notices changes in you that are concerning.  Do not drive or use heavy machinery if you feel dizzy.  Do not drink alcohol. SEEK IMMEDIATE MEDICAL CARE IF:   Your dizziness or lightheadedness gets worse.  You feel nauseous or vomit.  You develop problems with talking, walking, weakness, or using your arms, hands, or legs.  You are not thinking clearly or you have difficulty forming sentences. It may take a friend or family member to determine if your thinking is normal.  You develop chest pain, abdominal pain, shortness of breath, or sweating.  Your vision changes.  You notice any bleeding.  You have side effects from medicine that seems to be getting worse rather than better. MAKE SURE YOU:   Understand these instructions.  Will watch your condition.  Will get help right away if you are not doing well or get worse. Document Released: 09/22/2000 Document  Revised: 06/21/2011 Document Reviewed: 10/16/2010 [facility] Patient Information 2014 [place], [place].

## 2013-06-28 NOTE — ED Notes (Signed)
Pt  Reports  Pain and  Drainage  From        r  Ear       For  sev  Days        History  Of  Old  Injury  To  The  Affected ear

## 2014-02-11 ENCOUNTER — Encounter (HOSPITAL_COMMUNITY): Payer: Self-pay | Admitting: Emergency Medicine

## 2014-10-07 ENCOUNTER — Emergency Department (HOSPITAL_COMMUNITY)
Admission: EM | Admit: 2014-10-07 | Discharge: 2014-10-07 | Disposition: A | Discharge status: Home / Self Care | Payer: Self-pay | Attending: Emergency Medicine | Admitting: Emergency Medicine

## 2014-10-07 ENCOUNTER — Encounter (HOSPITAL_COMMUNITY): Payer: Self-pay | Admitting: Emergency Medicine

## 2014-10-07 DIAGNOSIS — H6500 Acute serous otitis media, unspecified ear: Secondary | ICD-10-CM | POA: Insufficient documentation

## 2014-10-07 DIAGNOSIS — M791 Myalgia: Secondary | ICD-10-CM | POA: Insufficient documentation

## 2014-10-07 DIAGNOSIS — Z792 Long term (current) use of antibiotics: Secondary | ICD-10-CM | POA: Insufficient documentation

## 2014-10-07 DIAGNOSIS — Z720 Tobacco use: Secondary | ICD-10-CM | POA: Insufficient documentation

## 2014-10-07 DIAGNOSIS — H6091 Unspecified otitis externa, right ear: Secondary | ICD-10-CM | POA: Insufficient documentation

## 2014-10-07 MED ORDER — AMOXICILLIN 500 MG PO CAPS: 500.0000 mg | ORAL_CAPSULE | Freq: Three times a day (TID) | ORAL | Status: DC

## 2014-10-07 MED ORDER — CIPROFLOXACIN-DEXAMETHASONE 0.3-0.1 % OT SUSP: 4.0000 [drp] | Freq: Two times a day (BID) | OTIC | Status: DC

## 2014-10-07 MED ORDER — HYDROCODONE-ACETAMINOPHEN 5-325 MG PO TABS: 1.0000 | ORAL_TABLET | Freq: Four times a day (QID) | ORAL | Status: DC | PRN

## 2014-10-07 NOTE — ED Provider Notes (Signed)
CSN: [id number]     Arrival date & time 10/07/14  0160 History  This chart was scribed for non-physician practitioner, Montine Circle, PA-C working with Leonard Schwartz, MD by Rayna Sexton, ED scribe. This patient was seen in room TR08C/TR08C and the patient's care was started at 10:17 AM.    Chief Complaint  Patient presents with  . Ear Drainage  . Otalgia   The history is provided by the patient. No language interpreter was used.    HPI Comments: Jill Bright is a 39 y.o. female who presents to the Emergency Department complaining of constant, moderate, right ear pain with onset 7 hours ago. She notes a foul smelling green discharge from the right ear as well as radiating pain to the right side of her face. She denies any swimming recently. Pt notes having had these symptoms last year and was diagnosed with swimmers ear. She denies being around anyone who has been sick recently and denies any known drug allergies. Pt denies any other symptoms.   History reviewed. No pertinent past medical history. Past Surgical History  Procedure Laterality Date  . Cholecystectomy     No family history on file. History  Substance Use Topics  . Smoking status: Light Tobacco Smoker  . Smokeless tobacco: Not on file  . Alcohol Use: No   OB History    Gravida Para Term Preterm AB TAB SAB Ectopic Multiple Living   6 3 3  2 1 1   3      Review of Systems  Constitutional: Negative for fever and chills.  HENT: Positive for ear discharge and ear pain.   Musculoskeletal: Positive for myalgias.      Allergies  Tea  Home Medications   Prior to Admission medications   Medication Sig Start Date End Date Taking? Authorizing Provider  amoxicillin-clavulanate (AUGMENTIN) 875-125 MG per tablet Take 1 tablet by mouth every 12 (twelve) hours. 06/28/13   Janne Napoleon, NP  HYDROcodone-acetaminophen (NORCO/VICODIN) 5-325 MG per tablet Take 1 tablet by mouth every 4 (four) hours as needed. 06/28/13   Janne Napoleon, NP  meclizine (ANTIVERT) 25 MG tablet Take 1 tablet (25 mg total) by mouth 3 (three) times daily as needed for dizziness. 06/28/13   Janne Napoleon, NP  promethazine (PHENERGAN) 25 MG tablet Take 1 tablet (25 mg total) by mouth every 6 (six) hours as needed for nausea. 01/24/13   Gregor Hams, MD   BP 124/79 mmHg  Pulse 76  Temp(Src) 97.9 F (36.6 C) (Oral)  Resp 20  Ht 5\' 6"  (1.676 m)  Wt 183 lb (83.008 kg)  BMI 29.55 kg/m2  SpO2 100%  LMP 08/30/2014 (Exact Date)  Breastfeeding? No Physical Exam  Constitutional: She is oriented to person, place, and time. She appears well-developed and well-nourished. No distress.  HENT:  Head: Normocephalic and atraumatic.  Mouth/Throat: Oropharynx is clear and moist.  Right ear canal is remarkable for debris and purulent drainage, no ttp of auricle  Eyes: Conjunctivae and EOM are normal. Pupils are equal, round, and reactive to light.  Neck: Normal range of motion. Neck supple. No tracheal deviation present.  Cardiovascular: Normal rate.   Pulmonary/Chest: Effort normal and breath sounds normal. No respiratory distress.  Abdominal: Soft. She exhibits no distension.  Musculoskeletal: Normal range of motion.  Neurological: She is alert and oriented to person, place, and time.  Skin: Skin is warm and dry.  Psychiatric: She has a normal mood and affect. Her behavior is normal.  Nursing  note and vitals reviewed.   ED Course  Procedures  DIAGNOSTIC STUDIES: Oxygen Saturation is 100% on RA, normal by my interpretation.    COORDINATION OF CARE: 10:20 AM Discussed treatment plan with pt at bedside and pt agreed to plan.  Labs Review Labs Reviewed - No data to display  Imaging Review No results found.   EKG Interpretation None      MDM   Final diagnoses:  Otitis externa, right  Acute serous otitis media, recurrence not specified, unspecified laterality    Patient with otitis externa and possibly otitis media. Will treat with  Cipro-dexamethasone optic drops, and amoxicillin. Vital signs stable. Patient understands agrees with plan. She is stable and ready for discharge.  I personally performed the services described in this documentation, which was scribed in my presence. The recorded information has been reviewed and is accurate.     Montine Circle, PA-C 10/07/14 [place], MD 10/08/14 [phone]

## 2014-10-07 NOTE — ED Notes (Signed)
Pt c/o onset at 0400 today with green drainage coming from right ear and pain in right ear. Pt has history of problems with her ears in the past.

## 2014-10-07 NOTE — Discharge Instructions (Signed)
Otitis Media With Effusion Otitis media with effusion is the presence of fluid in the middle ear. This is a common problem in children, which often follows ear infections. It may be present for weeks or longer after the infection. Unlike an acute ear infection, otitis media with effusion refers only to fluid behind the ear drum and not infection. Children with repeated ear and sinus infections and allergy problems are the most likely to get otitis media with effusion. CAUSES  The most frequent cause of the fluid buildup is dysfunction of the eustachian tubes. These are the tubes that drain fluid in the ears to the back of the nose (nasopharynx). SYMPTOMS   The main symptom of this condition is hearing loss. As a result, you or your child may:  Listen to the TV at a loud volume.  Not respond to questions.  Ask "what" often when spoken to.  Mistake or confuse one sound or word for another.  There may be a sensation of fullness or pressure but usually not pain. DIAGNOSIS   Your health care provider will diagnose this condition by examining you or your child's ears.  Your health care provider may test the pressure in you or your child's ear with a tympanometer.  A hearing test may be conducted if the problem persists. TREATMENT   Treatment depends on the duration and the effects of the effusion.  Antibiotics, decongestants, nose drops, and cortisone-type drugs (tablets or nasal spray) may not be helpful.  Children with persistent ear effusions may have delayed language or behavioral problems. Children at risk for developmental delays in hearing, learning, and speech may require referral to a specialist earlier than children not at risk.  You or your child's health care provider may suggest a referral to an ear, nose, and throat surgeon for treatment. The following may help restore normal hearing:  Drainage of fluid.  Placement of ear tubes (tympanostomy tubes).  Removal of adenoids  (adenoidectomy). HOME CARE INSTRUCTIONS   Avoid secondhand smoke.  Infants who are breastfed are less likely to have this condition.  Avoid feeding infants while they are lying flat.  Avoid known environmental allergens.  Avoid people who are sick. SEEK MEDICAL CARE IF:   Hearing is not better in 3 months.  Hearing is worse.  Ear pain.  Drainage from the ear.  Dizziness. MAKE SURE YOU:   Understand these instructions.  Will watch your condition.  Will get help right away if you are not doing well or get worse. Document Released: 05/06/2004 Document Revised: 08/13/2013 Document Reviewed: 10/24/2012 [facility] Patient Information 2015 [place], [place]. This information is not intended to replace advice given to you by your health care provider. Make sure you discuss any questions you have with your health care provider. Otitis Externa Otitis externa is a bacterial or fungal infection of the outer ear canal. This is the area from the eardrum to the outside of the ear. Otitis externa is sometimes called "swimmer's ear." CAUSES  Possible causes of infection include:  Swimming in dirty water.  Moisture remaining in the ear after swimming or bathing.  Mild injury (trauma) to the ear.  Objects stuck in the ear (foreign body).  Cuts or scrapes (abrasions) on the outside of the ear. SIGNS AND SYMPTOMS  The first symptom of infection is often itching in the ear canal. Later signs and symptoms may include swelling and redness of the ear canal, ear pain, and yellowish-white fluid (pus) coming from the ear. The ear pain  may be worse when pulling on the earlobe. DIAGNOSIS  Your health care provider will perform a physical exam. A sample of fluid may be taken from the ear and examined for bacteria or fungi. TREATMENT  Antibiotic ear drops are often given for 10 to 14 days. Treatment may also include pain medicine or corticosteroids to reduce itching and swelling. HOME CARE  INSTRUCTIONS   Apply antibiotic ear drops to the ear canal as prescribed by your health care provider.  Take medicines only as directed by your health care provider.  If you have diabetes, follow any additional treatment instructions from your health care provider.  Keep all follow-up visits as directed by your health care provider. PREVENTION   Keep your ear dry. Use the corner of a towel to absorb water out of the ear canal after swimming or bathing.  Avoid scratching or putting objects inside your ear. This can damage the ear canal or remove the protective wax that lines the canal. This makes it easier for bacteria and fungi to grow.  Avoid swimming in lakes, polluted water, or poorly chlorinated pools.  You may use ear drops made of rubbing alcohol and vinegar after swimming. Combine equal parts of white vinegar and alcohol in a bottle. Put 3 or 4 drops into each ear after swimming. SEEK MEDICAL CARE IF:   You have a fever.  Your ear is still red, swollen, painful, or draining pus after 3 days.  Your redness, swelling, or pain gets worse.  You have a severe headache.  You have redness, swelling, pain, or tenderness in the area behind your ear. MAKE SURE YOU:   Understand these instructions.  Will watch your condition.  Will get help right away if you are not doing well or get worse. Document Released: 03/29/2005 Document Revised: 08/13/2013 Document Reviewed: 04/15/2011 [facility] Patient Information 2015 [place], [place]. This information is not intended to replace advice given to you by your health care provider. Make sure you discuss any questions you have with your health care provider.

## 2015-07-30 ENCOUNTER — Inpatient Hospital Stay (HOSPITAL_COMMUNITY)
Admission: AD | Admit: 2015-07-30 | Discharge: 2015-07-30 | Disposition: A | Discharge status: Home / Self Care | Payer: Self-pay | Source: Ambulatory Visit | Attending: Obstetrics & Gynecology | Admitting: Obstetrics & Gynecology

## 2015-07-30 ENCOUNTER — Inpatient Hospital Stay (HOSPITAL_COMMUNITY): Payer: Self-pay

## 2015-07-30 ENCOUNTER — Encounter (HOSPITAL_COMMUNITY): Payer: Self-pay | Admitting: Student

## 2015-07-30 DIAGNOSIS — R1031 Right lower quadrant pain: Secondary | ICD-10-CM | POA: Insufficient documentation

## 2015-07-30 DIAGNOSIS — N760 Acute vaginitis: Secondary | ICD-10-CM

## 2015-07-30 DIAGNOSIS — O26891 Other specified pregnancy related conditions, first trimester: Secondary | ICD-10-CM | POA: Insufficient documentation

## 2015-07-30 DIAGNOSIS — O26899 Other specified pregnancy related conditions, unspecified trimester: Secondary | ICD-10-CM

## 2015-07-30 DIAGNOSIS — Z3A01 Less than 8 weeks gestation of pregnancy: Secondary | ICD-10-CM | POA: Insufficient documentation

## 2015-07-30 DIAGNOSIS — Z3491 Encounter for supervision of normal pregnancy, unspecified, first trimester: Secondary | ICD-10-CM

## 2015-07-30 DIAGNOSIS — O9989 Other specified diseases and conditions complicating pregnancy, childbirth and the puerperium: Secondary | ICD-10-CM

## 2015-07-30 DIAGNOSIS — R109 Unspecified abdominal pain: Secondary | ICD-10-CM

## 2015-07-30 DIAGNOSIS — F17210 Nicotine dependence, cigarettes, uncomplicated: Secondary | ICD-10-CM | POA: Insufficient documentation

## 2015-07-30 DIAGNOSIS — O23591 Infection of other part of genital tract in pregnancy, first trimester: Secondary | ICD-10-CM

## 2015-07-30 DIAGNOSIS — O99331 Smoking (tobacco) complicating pregnancy, first trimester: Secondary | ICD-10-CM | POA: Insufficient documentation

## 2015-07-30 DIAGNOSIS — B9689 Other specified bacterial agents as the cause of diseases classified elsewhere: Secondary | ICD-10-CM | POA: Insufficient documentation

## 2015-07-30 DIAGNOSIS — A499 Bacterial infection, unspecified: Secondary | ICD-10-CM

## 2015-07-30 LAB — POCT PREGNANCY, URINE: PREG TEST UR: POSITIVE — AB

## 2015-07-30 LAB — CBC
HEMATOCRIT: 33.1 % — AB (ref 36.0–46.0)
HEMOGLOBIN: 11.1 g/dL — AB (ref 12.0–15.0)
MCH: 29.1 pg (ref 26.0–34.0)
MCHC: 33.5 g/dL (ref 30.0–36.0)
MCV: 86.6 fL (ref 78.0–100.0)
Platelets: 254 10*3/uL (ref 150–400)
RBC: 3.82 MIL/uL — ABNORMAL LOW (ref 3.87–5.11)
RDW: 13.7 % (ref 11.5–15.5)
WBC: 10.2 10*3/uL (ref 4.0–10.5)

## 2015-07-30 LAB — WET PREP, GENITAL
Sperm: NONE SEEN
Trich, Wet Prep: NONE SEEN
YEAST WET PREP: NONE SEEN

## 2015-07-30 LAB — ABO/RH: ABO/RH(D): O POS

## 2015-07-30 LAB — URINALYSIS, ROUTINE W REFLEX MICROSCOPIC
Bilirubin Urine: NEGATIVE
Glucose, UA: NEGATIVE mg/dL
Hgb urine dipstick: NEGATIVE
Ketones, ur: NEGATIVE mg/dL
LEUKOCYTES UA: NEGATIVE
NITRITE: NEGATIVE
PROTEIN: NEGATIVE mg/dL
SPECIFIC GRAVITY, URINE: 1.025 (ref 1.005–1.030)
pH: 6 (ref 5.0–8.0)

## 2015-07-30 LAB — HCG, QUANTITATIVE, PREGNANCY: HCG, BETA CHAIN, QUANT, S: 122882 m[IU]/mL — AB (ref <5)

## 2015-07-30 MED ORDER — METRONIDAZOLE 500 MG PO TABS: 500.0000 mg | ORAL_TABLET | Freq: Two times a day (BID) | ORAL | Status: DC

## 2015-07-30 NOTE — MAU Provider Note (Signed)
History     CSN: [id number]  Arrival date and time: 07/30/15 1600   None      Chief Complaint  Patient presents with  . Abdominal Pain   HPI Jill Bright is a 40 y.o. [id number] who presents with RLQ pain x 4 days & pink spotting yesterday. Has not taken pregnancy test but states it could be possible that she is pregnant. States pain comes & goes in RLQ. Describes as sharp & rates 5/10. Pain is relieved with flatus. Denies n/v/d, constipation, dysuria, vaginal discharge.  Pink spotting on toilet paper yesterday x 1 episode.    OB History    Gravida Para Term Preterm AB TAB SAB Ectopic Multiple Living   9 3 3  5 3 2   3       Past Medical History  Diagnosis Date  . Medical history non-contributory     Past Surgical History  Procedure Laterality Date  . Cholecystectomy      History reviewed. No pertinent family history.  Social History  Substance Use Topics  . Smoking status: Light Tobacco Smoker -- 0.25 packs/day    Types: Cigarettes  . Smokeless tobacco: None  . Alcohol Use: No    Allergies:  Allergies  Allergen Reactions  . Tea Hives    Prescriptions prior to admission  Medication Sig Dispense Refill Last Dose  . acetaminophen (TYLENOL) 500 MG tablet Take 1,000 mg by mouth every 6 (six) hours as needed for moderate pain.    07/30/2015 at Unknown time    Review of Systems  Constitutional: Negative.   Gastrointestinal: Positive for abdominal pain. Negative for nausea, vomiting, diarrhea and constipation.  Genitourinary: Negative for dysuria.       + pink spotting yesterday   Physical Exam   Blood pressure 120/81, pulse 75, temperature 97.7 F (36.5 C), temperature source Oral, resp. rate 18, height 5\' 6"  (1.676 m), weight 192 lb 6.4 oz (87.272 kg), last menstrual period 06/25/2015.  Physical Exam  Nursing note and vitals reviewed. Constitutional: She is oriented to person, place, and time. She appears well-developed and well-nourished. No distress.   HENT:  Head: Normocephalic and atraumatic.  Eyes: Conjunctivae are normal. Right eye exhibits no discharge. Left eye exhibits no discharge. No scleral icterus.  Neck: Normal range of motion.  Cardiovascular: Normal rate, regular rhythm and normal heart sounds.   No murmur heard. Respiratory: Effort normal and breath sounds normal. No respiratory distress. She has no wheezes.  GI: Soft.  Genitourinary: Uterus normal. Cervix exhibits no motion tenderness. No bleeding in the vagina. Vaginal discharge (small amount of thin white discharge) found.  Cervix closed  Neurological: She is alert and oriented to person, place, and time.  Skin: Skin is warm and dry. She is not diaphoretic.  Psychiatric: She has a normal mood and affect. Her behavior is normal. Judgment and thought content normal.    MAU Course  Procedures Results for orders placed or performed during the hospital encounter of 07/30/15 (from the past 24 hour(s))  Urinalysis, Routine w reflex microscopic (not at [facility])     Status: None   Collection Time: 07/30/15  4:20 PM  Result Value Ref Range   Color, Urine YELLOW YELLOW   APPearance CLEAR CLEAR   Specific Gravity, Urine 1.025 1.005 - 1.030   pH 6.0 5.0 - 8.0   Glucose, UA NEGATIVE NEGATIVE mg/dL   Hgb urine dipstick NEGATIVE NEGATIVE   Bilirubin Urine NEGATIVE NEGATIVE   Ketones, ur NEGATIVE NEGATIVE mg/dL  Protein, ur NEGATIVE NEGATIVE mg/dL   Nitrite NEGATIVE NEGATIVE   Leukocytes, UA NEGATIVE NEGATIVE  Pregnancy, urine POC     Status: Abnormal   Collection Time: 07/30/15  4:27 PM  Result Value Ref Range   Preg Test, Ur POSITIVE (A) NEGATIVE  CBC     Status: Abnormal   Collection Time: 07/30/15  4:44 PM  Result Value Ref Range   WBC 10.2 4.0 - 10.5 K/uL   RBC 3.82 (L) 3.87 - 5.11 MIL/uL   Hemoglobin 11.1 (L) 12.0 - 15.0 g/dL   HCT 33.1 (L) 36.0 - 46.0 %   MCV 86.6 78.0 - 100.0 fL   MCH 29.1 26.0 - 34.0 pg   MCHC 33.5 30.0 - 36.0 g/dL   RDW 13.7 11.5 - 15.5 %    Platelets 254 150 - 400 K/uL  ABO/Rh     Status: None   Collection Time: 07/30/15  4:44 PM  Result Value Ref Range   ABO/RH(D) O POS   Wet prep, genital     Status: Abnormal   Collection Time: 07/30/15  4:44 PM  Result Value Ref Range   Yeast Wet Prep HPF POC NONE SEEN NONE SEEN   Trich, Wet Prep NONE SEEN NONE SEEN   Clue Cells Wet Prep HPF POC PRESENT (A) NONE SEEN   WBC, Wet Prep HPF POC MODERATE (A) NONE SEEN   Sperm NONE SEEN   hCG, quantitative, pregnancy     Status: Abnormal   Collection Time: 07/30/15  4:45 PM  Result Value Ref Range   hCG, Beta Chain, Quant, S [id number] (H) <5 mIU/mL   US Ob Comp Less 14 Wks  07/30/2015  CLINICAL DATA:  Right lower quadrant pain, 4 days duration. Positive beta HCG. EXAM: OBSTETRIC <14 WK [place] AND TRANSVAGINAL OB US TECHNIQUE: Both transabdominal and transvaginal ultrasound examinations were performed for complete evaluation of the gestation as well as the maternal uterus, adnexal regions, and pelvic cul-de-sac. Transvaginal technique was performed to assess early pregnancy. COMPARISON:  None. FINDINGS: Intrauterine gestational sac: Present and normal Yolk sac:  Present and normal Embryo:  Present and normal Cardiac Activity: Present Heart Rate: 157  bpm CRL:  12.6  mm   7 w   for d                  [place] EDC: 03/13/2016 Subchorionic hemorrhage:  None Maternal uterus/adnexae: Both ovaries appear normal. Corpus luteal cyst on the right measuring 2.1 x 1.9 x 1.3 cm. Trace amount of free fluid, not concerning. IMPRESSION: Normal appearing single living intrauterine pregnancy at 7 weeks 4 days by crown-rump length. No abnormality seen to explain pain. Electronically Signed   By: Nelson Chimes M.D.   On: 07/30/2015 18:04   US Ob Transvaginal  07/30/2015  CLINICAL DATA:  Right lower quadrant pain, 4 days duration. Positive beta HCG. EXAM: OBSTETRIC <14 WK [place] AND TRANSVAGINAL OB US TECHNIQUE: Both transabdominal and transvaginal ultrasound examinations were performed  for complete evaluation of the gestation as well as the maternal uterus, adnexal regions, and pelvic cul-de-sac. Transvaginal technique was performed to assess early pregnancy. COMPARISON:  None. FINDINGS: Intrauterine gestational sac: Present and normal Yolk sac:  Present and normal Embryo:  Present and normal Cardiac Activity: Present Heart Rate: 157  bpm CRL:  12.6  mm   7 w   for d                  [place] EDC: 03/13/2016 Subchorionic hemorrhage:  None Maternal uterus/adnexae: Both ovaries appear normal. Corpus luteal cyst on the right measuring 2.1 x 1.9 x 1.3 cm. Trace amount of free fluid, not concerning. IMPRESSION: Normal appearing single living intrauterine pregnancy at 7 weeks 4 days by crown-rump length. No abnormality seen to explain pain. Electronically Signed   By: Nelson Chimes M.D.   On: 07/30/2015 18:04    MDM UPT positive CBC, ultrasound, abo/rh, BHCG, GC/CT, wet prep Ultrasound shows SIUP with cardiac activity Wet prep shows clue cells  Assessment and Plan  A: 1. Normal IUP (intrauterine pregnancy) on prenatal ultrasound, first trimester   2. Abdominal pain in pregnancy   3. BV (bacterial vaginosis)     P; Discharge home Rx flagyl Start prenatal vitamins Pregnancy verification letter & ob/gyn list given Start prenatal care GC/CT pending  Jorje Guild 07/30/2015, 6:31 PM

## 2015-07-30 NOTE — MAU Note (Signed)
Pt C/O RLQ pain for the past 4 days, unsure if pregnant.  Has not done HPT.  Had pink discharge yesterday, none today.

## 2015-07-30 NOTE — MAU Provider Note (Signed)
History     CSN: [id number]  Arrival date and time: 07/30/15 1600   None     Chief Complaint  Patient presents with  . Abdominal Pain   HPI 40yo [id number] at [redacted]w[redacted]d by [place] today presented to MAU with RLQ pain.  Pt reports that the pain started around 4 days ago and was coming and going, but that today it was a 7/10 and she was concerned that she may have a bladder infection.  Pt reports that she has taken ES Tylenol and it has helped some with her pain.  Pt has not had fever.  Pt denies dysuria, frequency or urgency. Patient had one episode of blood when she wiped after urinating yesterday, but has had no other vaginal bleeding or discharge.  Pt denies dyspareunia or post-coital bleeding.  Patient reports that she had a trichomonas infection in the past but that it was treated.  Pt denies nausea, vomiting, or diarrhea, and reports regular bowel movements.  Pt does report that she has had increased flatus the last few days, and that her pain improves after she has flatus.  She attributes this to a recent increased appetite, and eating more fatty foods and greens in the last few weeks. She says that in addition to increased appetite, she has been more fatigued recently and has had some tension headaches.  OB History    Gravida Para Term Preterm AB TAB SAB Ectopic Multiple Living   9 3 3  5 3 2   3       Past Medical History  Diagnosis Date  . Medical history non-contributory     Past Surgical History  Procedure Laterality Date  . Cholecystectomy      History reviewed. No pertinent family history.  Social History  Substance Use Topics  . Smoking status: Light Tobacco Smoker -- 0.25 packs/day    Types: Cigarettes  . Smokeless tobacco: None  . Alcohol Use: No    Allergies:  Allergies  Allergen Reactions  . Tea Hives    Prescriptions prior to admission  Medication Sig Dispense Refill Last Dose  . acetaminophen (TYLENOL) 500 MG tablet Take 1,000 mg by mouth every 6 (six) hours  as needed for moderate pain.    07/30/2015 at Unknown time    Review of Systems  Constitutional: Negative for fever.  Respiratory: Negative for shortness of breath.   Cardiovascular: Negative for chest pain.  Gastrointestinal: Positive for abdominal pain. Negative for heartburn, nausea, vomiting, diarrhea and constipation.       Flatus  Genitourinary: Negative for dysuria, urgency and frequency.  Neurological: Positive for headaches. Negative for dizziness.   Physical Exam   Blood pressure 131/84, pulse 73, temperature 97.7 F (36.5 C), temperature source Oral, resp. rate 18, height 5\' 6"  (1.676 m), weight 87.272 kg (192 lb 6.4 oz), last menstrual period 06/25/2015.  Physical Exam  Constitutional: She is oriented to person, place, and time. She appears well-nourished. No distress.  HENT:  Head: Normocephalic.  Eyes: EOM are normal. Pupils are equal, round, and reactive to light.  Neck: Normal range of motion. Neck supple.  Cardiovascular: Normal rate and regular rhythm.  Exam reveals no gallop and no friction rub.   No murmur heard. Respiratory: Effort normal and breath sounds normal.  GI: Soft. Bowel sounds are normal. She exhibits no distension and no mass. There is no rebound and no guarding.  Right suprapubic tenderness  Genitourinary: Vagina normal and uterus normal.  White discharge noted; no cervical motion  tenderness  Musculoskeletal: Normal range of motion.  Neurological: She is alert and oriented to person, place, and time.  Skin: Skin is warm and dry.    MAU Course  Procedures  MDM 39yo [id number] at [redacted]w[redacted]d by [place] today presented to MAU with RLQ pain. Normal U/A and white count in afebrile patient make UTI unlikely.  Normal abdominal exam without guarding, rebound or RLQ tenderness make appendicitis unlikely.  With confirmed IUP at [redacted]w[redacted]d by [place], ectopic pregnancy is ruled out.  Patient's pain is likely due to gas pain associated with increased flatus and normal pregnancy.   Due to clue cells found on wet prep, pt will be treated for BV.  Will treat patient if GC/Chlamydia comes back positive.  Assessment and Plan  39yo [id number] at [redacted]w[redacted]d by [place] today presented to MAU with abdominal pain likely due to increased flatus and normal pregnancy.  #Abdominal pain: -Tylenol 1000 mg q6h PRN for pain -Counseled patient on diet and ways to decrease flatus  #BV: -Metronidazole 500 mg BID for 7 days  #Pregnancy: -List of area providers given to pt and counseled on setting up [facility] -Pt counseled on taking prenatal vitamins    Ernest Pine 07/30/2015, 6:52 PM        I have seen and evaluated the patient with the NP/PA/Med student. I agree with the assessment and plan as written above. See provider note for full documentation  Jorje Guild, NP  07/30/2015 7:25 PM

## 2015-07-30 NOTE — Discharge Instructions (Signed)
Bacterial Vaginosis Bacterial vaginosis is an infection of the vagina. It happens when too many germs (bacteria) grow in the vagina. Having this infection puts you at risk for getting other infections from sex. Treating this infection can help lower your risk for other infections, such as:   Chlamydia.  Gonorrhea.  HIV.  Herpes. HOME CARE  Take your medicine as told by your doctor.  Finish your medicine even if you start to feel better.  Tell your sex partner that you have an infection. They should see their doctor for treatment.  During treatment:  Avoid sex or use condoms correctly.  Do not douche.  Do not drink alcohol unless your doctor tells you it is ok.  Do not breastfeed unless your doctor tells you it is ok. GET HELP IF:  You are not getting better after 3 days of treatment.  You have more grey fluid (discharge) coming from your vagina than before.  You have more pain than before.  You have a fever. MAKE SURE YOU:   Understand these instructions.  Will watch your condition.  Will get help right away if you are not doing well or get worse.   This information is not intended to replace advice given to you by your health care provider. Make sure you discuss any questions you have with your health care provider.   Document Released: 01/06/2008 Document Revised: 04/19/2014 Document Reviewed: 11/08/2012 Elsevier Interactive Patient Education 2016 Elsevier [employer] Abdominal Pain During Pregnancy Abdominal pain is common in pregnancy. Most of the time, it does not cause harm. There are many causes of abdominal pain. Some causes are more serious than others. Some of the causes of abdominal pain in pregnancy are easily diagnosed. Occasionally, the diagnosis takes time to understand. Other times, the cause is not determined. Abdominal pain can be a sign that something is very wrong with the pregnancy, or the pain may have nothing to do with the pregnancy at all. For  this reason, always tell your health care provider if you have any abdominal discomfort. HOME CARE INSTRUCTIONS  Monitor your abdominal pain for any changes. The following actions may help to alleviate any discomfort you are experiencing:  Do not have sexual intercourse or put anything in your vagina until your symptoms go away completely.  Get plenty of rest until your pain improves.  Drink clear fluids if you feel nauseous. Avoid solid food as long as you are uncomfortable or nauseous.  Only take over-the-counter or prescription medicine as directed by your health care provider.  Keep all follow-up appointments with your health care provider. SEEK IMMEDIATE MEDICAL CARE IF:  You are bleeding, leaking fluid, or passing tissue from the vagina.  You have increasing pain or cramping.  You have persistent vomiting.  You have painful or bloody urination.  You have a fever.  You notice a decrease in your baby's movements.  You have extreme weakness or feel faint.  You have shortness of breath, with or without abdominal pain.  You develop a severe headache with abdominal pain.  You have abnormal vaginal discharge with abdominal pain.  You have persistent diarrhea.  You have abdominal pain that continues even after rest, or gets worse. MAKE SURE YOU:   Understand these instructions.  Will watch your condition.  Will get help right away if you are not doing well or get worse.   This information is not intended to replace advice given to you by your health care provider. Make sure you discuss  any questions you have with your health care provider.   Document Released: 03/29/2005 Document Revised: 01/17/2013 Document Reviewed: 10/26/2012 Elsevier Interactive Patient Education [employer].

## 2015-07-31 LAB — OB RESULTS CONSOLE GC/CHLAMYDIA
Chlamydia: NEGATIVE
Gonorrhea: NEGATIVE

## 2015-07-31 LAB — HIV ANTIBODY (ROUTINE TESTING W REFLEX): HIV SCREEN 4TH GENERATION: NONREACTIVE

## 2015-07-31 LAB — GC/CHLAMYDIA PROBE AMP (~~LOC~~) NOT AT ARMC
CHLAMYDIA, DNA PROBE: NEGATIVE
NEISSERIA GONORRHEA: NEGATIVE

## 2015-09-24 ENCOUNTER — Encounter (HOSPITAL_COMMUNITY): Payer: Self-pay | Admitting: *Deleted

## 2015-09-24 ENCOUNTER — Inpatient Hospital Stay (HOSPITAL_COMMUNITY)
Admission: AD | Admit: 2015-09-24 | Discharge: 2015-09-24 | Disposition: A | Discharge status: Home / Self Care | Payer: Medicaid Other | Source: Ambulatory Visit | Attending: Obstetrics and Gynecology | Admitting: Obstetrics and Gynecology

## 2015-09-24 DIAGNOSIS — O26892 Other specified pregnancy related conditions, second trimester: Secondary | ICD-10-CM | POA: Diagnosis not present

## 2015-09-24 DIAGNOSIS — F17210 Nicotine dependence, cigarettes, uncomplicated: Secondary | ICD-10-CM | POA: Diagnosis not present

## 2015-09-24 DIAGNOSIS — Z3A15 15 weeks gestation of pregnancy: Secondary | ICD-10-CM | POA: Diagnosis not present

## 2015-09-24 DIAGNOSIS — R030 Elevated blood-pressure reading, without diagnosis of hypertension: Secondary | ICD-10-CM | POA: Diagnosis present

## 2015-09-24 DIAGNOSIS — O99332 Smoking (tobacco) complicating pregnancy, second trimester: Secondary | ICD-10-CM | POA: Diagnosis not present

## 2015-09-24 DIAGNOSIS — Z3492 Encounter for supervision of normal pregnancy, unspecified, second trimester: Secondary | ICD-10-CM

## 2015-09-24 DIAGNOSIS — O9989 Other specified diseases and conditions complicating pregnancy, childbirth and the puerperium: Secondary | ICD-10-CM | POA: Diagnosis not present

## 2015-09-24 HISTORY — DX: Unspecified abnormal cytological findings in specimens from vagina: R87.629

## 2015-09-24 NOTE — Discharge Instructions (Signed)

## 2015-09-24 NOTE — MAU Provider Note (Signed)
History     CSN: [id number]  Arrival date and time: 09/24/15 1728   First Provider Initiated Contact with Patient 09/24/15 1838      Chief Complaint  Patient presents with  . Hypertension   HPI Jill Bright is a 40 y.o. [id number] at [redacted]w[redacted]d who was sent from the office for hypertension. Patient denies any history of chronic hypertension nor pregnancy related hypertension. Was in office today for ROV & found to have elevated BPs 200s/140s. BP was taken 4 times by 2 different nurses and was elevated every time. Denies headache, chest pain, dizziness, SOB, or abdominal pain.   OB History    Gravida Para Term Preterm AB TAB SAB Ectopic Multiple Living   9 3 3  5 3 2   6       Past Medical History  Diagnosis Date  . Medical history non-contributory   . Vaginal Pap smear, abnormal     LEEP, ok since    Past Surgical History  Procedure Laterality Date  . Cholecystectomy    . Dilation and curettage of uterus    . Therapeutic abortion      Family History  Problem Relation Age of Onset  . Diabetes Mother   . Hypertension Mother     Social History  Substance Use Topics  . Smoking status: Light Tobacco Smoker -- 0.25 packs/day for 4 years    Types: Cigarettes  . Smokeless tobacco: Never Used     Comment: cutting back 2/day for the past year  . Alcohol Use: No    Allergies:  Allergies  Allergen Reactions  . Tea Hives    Prescriptions prior to admission  Medication Sig Dispense Refill Last Dose  . acetaminophen (TYLENOL) 500 MG tablet Take 1,000 mg by mouth every 6 (six) hours as needed for moderate pain.    07/30/2015 at Unknown time  . metroNIDAZOLE (FLAGYL) 500 MG tablet Take 1 tablet (500 mg total) by mouth 2 (two) times daily. 14 tablet 0     Review of Systems  Constitutional: Negative.   Respiratory: Negative for shortness of breath.   Cardiovascular: Negative for chest pain and palpitations.  Gastrointestinal: Negative for abdominal pain.  Genitourinary:  Negative.   Neurological: Negative for headaches.   Physical Exam   Blood pressure 117/71, pulse 76, temperature 98.2 F (36.8 C), temperature source Oral, resp. rate 16, weight 194 lb (87.998 kg), last menstrual period 06/25/2015.  Patient Vitals for the past 24 hrs:  BP Temp Temp src Pulse Resp Weight  09/24/15 1903 117/71 mmHg - - 76 - -  09/24/15 1902 122/74 mmHg - - 78 16 -  09/24/15 1806 126/72 mmHg - - 81 - -  09/24/15 1801 129/82 mmHg 98.2 F (36.8 C) Oral 76 18 194 lb (87.998 kg)    Physical Exam  Nursing note and vitals reviewed. Constitutional: She is oriented to person, place, and time. She appears well-developed and well-nourished. No distress.  HENT:  Head: Normocephalic and atraumatic.  Eyes: Conjunctivae are normal. Right eye exhibits no discharge. Left eye exhibits no discharge. No scleral icterus.  Neck: Normal range of motion.  Cardiovascular: Normal rate, regular rhythm and normal heart sounds.   No murmur heard. Respiratory: Effort normal and breath sounds normal. No respiratory distress. She has no wheezes.  Neurological: She is alert and oriented to person, place, and time.  Skin: Skin is warm and dry. She is not diaphoretic.  Psychiatric: She has a normal mood and affect. Her behavior  is normal. Judgment and thought content normal.    MAU Course  Procedures No results found for this or any previous visit (from the past 24 hour(s)).  MDM FHT 156 by doppler All BPs taken here are WNL -- BP taken x 4 on both arms & manual x 1 S/w Dr. Ouida Sills. Ok to discharge home & keep f/u in office.  Assessment and Plan  A: 1. Fetal heart tones present, second trimester     P: Discharge home Keep f/u with OB Discussed reasons to return to [place] 09/24/2015, 7:10 PM

## 2015-09-24 NOTE — MAU Note (Signed)
went to dr's appt today and her BP was extremely high.  So they sent her here."but she feels good."  No prior hx of high blood pressure.  Denies HA, visual changes, increase in swelling or epigastric pain.

## 2015-09-24 NOTE — MAU Note (Signed)
Urine in lab 

## 2015-09-25 ENCOUNTER — Encounter (HOSPITAL_COMMUNITY): Payer: Self-pay | Admitting: Student

## 2015-09-25 LAB — OB RESULTS CONSOLE HEPATITIS B SURFACE ANTIGEN: HEP B S AG: UNDETERMINED

## 2015-09-25 LAB — OB RESULTS CONSOLE RUBELLA ANTIBODY, IGM: RUBELLA: IMMUNE

## 2015-09-25 LAB — OB RESULTS CONSOLE RPR: RPR: NONREACTIVE

## 2015-10-03 ENCOUNTER — Other Ambulatory Visit: Payer: Self-pay | Admitting: Obstetrics & Gynecology

## 2015-10-06 LAB — CYTOLOGY - PAP

## 2016-01-26 ENCOUNTER — Encounter (HOSPITAL_COMMUNITY): Payer: Self-pay | Admitting: *Deleted

## 2016-01-26 ENCOUNTER — Inpatient Hospital Stay (HOSPITAL_COMMUNITY): Payer: Medicaid Other

## 2016-01-26 ENCOUNTER — Inpatient Hospital Stay (HOSPITAL_COMMUNITY)
Admission: AD | Admit: 2016-01-26 | Discharge: 2016-02-03 | DRG: 765 | Disposition: A | Discharge status: Home / Self Care | Payer: Medicaid Other | Source: Ambulatory Visit | Attending: Obstetrics & Gynecology | Admitting: Obstetrics & Gynecology

## 2016-01-26 DIAGNOSIS — O321XX0 Maternal care for breech presentation, not applicable or unspecified: Secondary | ICD-10-CM | POA: Diagnosis present

## 2016-01-26 DIAGNOSIS — O99334 Smoking (tobacco) complicating childbirth: Secondary | ICD-10-CM | POA: Diagnosis present

## 2016-01-26 DIAGNOSIS — E669 Obesity, unspecified: Secondary | ICD-10-CM | POA: Diagnosis present

## 2016-01-26 DIAGNOSIS — Z833 Family history of diabetes mellitus: Secondary | ICD-10-CM

## 2016-01-26 DIAGNOSIS — Z8249 Family history of ischemic heart disease and other diseases of the circulatory system: Secondary | ICD-10-CM | POA: Diagnosis not present

## 2016-01-26 DIAGNOSIS — O42913 Preterm premature rupture of membranes, unspecified as to length of time between rupture and onset of labor, third trimester: Secondary | ICD-10-CM | POA: Diagnosis present

## 2016-01-26 DIAGNOSIS — O42919 Preterm premature rupture of membranes, unspecified as to length of time between rupture and onset of labor, unspecified trimester: Secondary | ICD-10-CM

## 2016-01-26 DIAGNOSIS — Z6832 Body mass index (BMI) 32.0-32.9, adult: Secondary | ICD-10-CM

## 2016-01-26 DIAGNOSIS — O42013 Preterm premature rupture of membranes, onset of labor within 24 hours of rupture, third trimester: Secondary | ICD-10-CM | POA: Diagnosis present

## 2016-01-26 DIAGNOSIS — D259 Leiomyoma of uterus, unspecified: Secondary | ICD-10-CM | POA: Diagnosis present

## 2016-01-26 DIAGNOSIS — O99214 Obesity complicating childbirth: Secondary | ICD-10-CM | POA: Diagnosis present

## 2016-01-26 DIAGNOSIS — Z3A33 33 weeks gestation of pregnancy: Secondary | ICD-10-CM

## 2016-01-26 DIAGNOSIS — O3413 Maternal care for benign tumor of corpus uteri, third trimester: Secondary | ICD-10-CM | POA: Diagnosis present

## 2016-01-26 DIAGNOSIS — F17210 Nicotine dependence, cigarettes, uncomplicated: Secondary | ICD-10-CM | POA: Diagnosis present

## 2016-01-26 LAB — CBC
HCT: 29.1 % — ABNORMAL LOW (ref 36.0–46.0)
Hemoglobin: 9.8 g/dL — ABNORMAL LOW (ref 12.0–15.0)
MCH: 29.4 pg (ref 26.0–34.0)
MCHC: 33.7 g/dL (ref 30.0–36.0)
MCV: 87.4 fL (ref 78.0–100.0)
PLATELETS: 150 10*3/uL (ref 150–400)
RBC: 3.33 MIL/uL — AB (ref 3.87–5.11)
RDW: 13.8 % (ref 11.5–15.5)
WBC: 8.7 10*3/uL (ref 4.0–10.5)

## 2016-01-26 LAB — TYPE AND SCREEN
ABO/RH(D): O POS
ANTIBODY SCREEN: NEGATIVE

## 2016-01-26 LAB — OB RESULTS CONSOLE GBS: STREP GROUP B AG: NEGATIVE

## 2016-01-26 MED ORDER — ACETAMINOPHEN 325 MG PO TABS
650.0000 mg | ORAL_TABLET | ORAL | Status: DC | PRN
  Administered 2016-01-27 – 2016-01-29 (×4): 650 mg via ORAL
  Filled 2016-01-26 (×4): qty 2

## 2016-01-26 MED ORDER — BETAMETHASONE SOD PHOS & ACET 6 (3-3) MG/ML IJ SUSP
12.0000 mg | Freq: Once | INTRAMUSCULAR | Status: AC
  Administered 2016-01-27: 12 mg via INTRAMUSCULAR
  Filled 2016-01-26: qty 2

## 2016-01-26 MED ORDER — LACTATED RINGERS IV SOLN
INTRAVENOUS | Status: DC
  Administered 2016-01-26 – 2016-01-30 (×10): via INTRAVENOUS

## 2016-01-26 MED ORDER — DOCUSATE SODIUM 100 MG PO CAPS
100.0000 mg | ORAL_CAPSULE | Freq: Every day | ORAL | Status: DC
  Administered 2016-01-27 – 2016-01-30 (×4): 100 mg via ORAL
  Filled 2016-01-26 (×6): qty 1

## 2016-01-26 MED ORDER — ZOLPIDEM TARTRATE 5 MG PO TABS: 5.0000 mg | ORAL_TABLET | Freq: Every evening | ORAL | Status: DC | PRN

## 2016-01-26 MED ORDER — CALCIUM CARBONATE ANTACID 500 MG PO CHEW
2.0000 | CHEWABLE_TABLET | ORAL | Status: DC | PRN
  Administered 2016-01-30 – 2016-01-31 (×2): 400 mg via ORAL
  Filled 2016-01-26 (×2): qty 2

## 2016-01-26 MED ORDER — AZITHROMYCIN 250 MG PO TABS
500.0000 mg | ORAL_TABLET | Freq: Every day | ORAL | Status: DC
  Administered 2016-01-28 – 2016-01-30 (×3): 500 mg via ORAL
  Filled 2016-01-26 (×3): qty 2

## 2016-01-26 MED ORDER — AMOXICILLIN 500 MG PO CAPS
500.0000 mg | ORAL_CAPSULE | Freq: Three times a day (TID) | ORAL | Status: DC
  Administered 2016-01-28 – 2016-01-31 (×8): 500 mg via ORAL
  Filled 2016-01-26 (×8): qty 1

## 2016-01-26 MED ORDER — DEXTROSE 5 % IV SOLN
500.0000 mg | INTRAVENOUS | Status: AC
  Administered 2016-01-26 – 2016-01-27 (×2): 500 mg via INTRAVENOUS
  Filled 2016-01-26 (×2): qty 500

## 2016-01-26 MED ORDER — PRENATAL MULTIVITAMIN CH
1.0000 | ORAL_TABLET | Freq: Every day | ORAL | Status: DC
  Administered 2016-01-27 – 2016-01-30 (×4): 1 via ORAL
  Filled 2016-01-26 (×6): qty 1

## 2016-01-26 MED ORDER — INFLUENZA VAC SPLIT QUAD 0.5 ML IM SUSY
0.5000 mL | PREFILLED_SYRINGE | INTRAMUSCULAR | Status: AC
  Administered 2016-01-27: 0.5 mL via INTRAMUSCULAR
  Filled 2016-01-26 (×2): qty 0.5

## 2016-01-26 MED ORDER — BETAMETHASONE SOD PHOS & ACET 6 (3-3) MG/ML IJ SUSP
12.0000 mg | Freq: Once | INTRAMUSCULAR | Status: AC
  Administered 2016-01-26: 12 mg via INTRAMUSCULAR
  Filled 2016-01-26: qty 2

## 2016-01-26 MED ORDER — SODIUM CHLORIDE 0.9 % IV SOLN
2.0000 g | Freq: Four times a day (QID) | INTRAVENOUS | Status: AC
  Administered 2016-01-26 – 2016-01-28 (×8): 2 g via INTRAVENOUS
  Filled 2016-01-26 (×8): qty 2000

## 2016-01-26 NOTE — MAU Provider Note (Signed)
History     CSN: [id number]  Arrival date and time: 01/26/16 1304   First Provider Initiated Contact with Patient 01/26/16 1321      Chief Complaint  Patient presents with  . Rupture of Membranes   HPI   [name] is a 40 y.o. female [phone] @ [redacted]w[redacted]d here with leaking of fluid and possible ROM. Around 1130 she was standing in the kitchen making oatmeal and felt a gush of "milkly" fluid from her vagina. She got in the shower and dried off completely and when she got out of the shower she continued to have leaking. The fluid was enough to saturate two towels.   She denies pain or bleeding. She is without complaints at this time other than leaking.   OB History    Gravida Para Term Preterm AB Living   9 3 3   5 3    SAB TAB Ectopic Multiple Live Births   2 3     3       Past Medical History:  Diagnosis Date  . Vaginal Pap smear, abnormal    LEEP, ok since    Past Surgical History:  Procedure Laterality Date  . CHOLECYSTECTOMY    . DILATION AND CURETTAGE OF UTERUS    . THERAPEUTIC ABORTION      Family History  Problem Relation Age of Onset  . Diabetes Mother   . Hypertension Mother     Social History  Substance Use Topics  . Smoking status: Light Tobacco Smoker    Packs/day: 0.25    Years: 4.00    Types: Cigarettes  . Smokeless tobacco: Never Used     Comment: cutting back 2/day for the past year  . Alcohol use No    Allergies:  Allergies  Allergen Reactions  . Tea Hives    Prescriptions Prior to Admission  Medication Sig Dispense Refill Last Dose  . acetaminophen (TYLENOL) 500 MG tablet Take 1,000 mg by mouth every 6 (six) hours as needed for moderate pain.    01/25/2016 at Unknown time  . promethazine (PHENERGAN) 25 MG tablet Take 25 mg by mouth every 6 (six) hours as needed for nausea or vomiting.   01/25/2016 at Unknown time   Results for orders placed or performed during the hospital encounter of 01/26/16 (from the past 48 hour(s))   Type and screen [place]     Status: None   Collection Time: 01/26/16  2:09 PM  Result Value Ref Range   ABO/RH(D) O POS    Antibody Screen NEG    Sample Expiration 01/29/2016   CBC on admission     Status: Abnormal   Collection Time: 01/26/16  2:09 PM  Result Value Ref Range   WBC 8.7 4.0 - 10.5 K/uL   RBC 3.33 (L) 3.87 - 5.11 MIL/uL   Hemoglobin 9.8 (L) 12.0 - 15.0 g/dL   HCT 29.1 (L) 36.0 - 46.0 %   MCV 87.4 78.0 - 100.0 fL   MCH 29.4 26.0 - 34.0 pg   MCHC 33.7 30.0 - 36.0 g/dL   RDW 13.8 11.5 - 15.5 %   Platelets 150 150 - 400 K/uL    Review of Systems  Constitutional: Negative for chills and fever.  Gastrointestinal: Negative for abdominal pain.  Psychiatric/Behavioral: Memory loss:    Physical Exam   Blood pressure 113/73, pulse 112, temperature 97.7 F (36.5 C), temperature source Oral, resp. rate 18, height 5\' 6"  (1.676 m), weight 201 lb (91.2 kg),  last menstrual period 06/25/2015.  Physical Exam  Constitutional: She is oriented to person, place, and time. She appears well-developed and well-nourished. No distress.  HENT:  Head: Normocephalic.  Eyes: Pupils are equal, round, and reactive to light.  Musculoskeletal: Normal range of motion.  Neurological: She is alert and oriented to person, place, and time.  Skin: Skin is warm. She is not diaphoretic.  Psychiatric: Her behavior is normal.   Fetal Tracing: Baseline: 140 bpm  Variability: Moderate  Accelerations: 15x15 Decelerations: None Toco: None  MAU Course  Procedures  None  MDM + fern slide  Discussed HPI and exam with Dr. Alwyn Pea @ 1330 will admit to [facility]  Betamethasone given   Assessment and [place], NP 01/26/2016 6:10 PM

## 2016-01-26 NOTE — MAU Note (Addendum)
At 1142 she felt a gush of fluid. Has continued leaking and was leaking on arrival to MAU. Denies problems with pregnancy. + FM.

## 2016-01-26 NOTE — H&P (Signed)
Jill Bright is a 40 y.o. female [phone] @ [redacted]w[redacted]d here with leaking of fluid and possible ROM. Around 1130 she was standing in the kitchen making oatmeal and felt a gush of "milkly" fluid from her vagina. She got in the shower and dried off completely and when she got out of the shower she continued to have leaking. The fluid was enough to saturate two towels. . OB History    Gravida Para Term Preterm AB Living   9 3 3   5 3    SAB TAB Ectopic Multiple Live Births   2 3     3      Past Medical History:  Diagnosis Date  . Vaginal Pap smear, abnormal    LEEP, ok since   Past Surgical History:  Procedure Laterality Date  . CHOLECYSTECTOMY    . DILATION AND CURETTAGE OF UTERUS    . THERAPEUTIC ABORTION     Family History: family history includes Diabetes in her mother; Hypertension in her mother. Social History:  reports that she has been smoking Cigarettes.  She has a 1.00 pack-year smoking history. She has never used smokeless tobacco. She reports that she does not drink alcohol or use drugs.     Maternal Diabetes: No Genetic Screening: Normal Maternal Ultrasounds/Referrals: Normal Fetal Ultrasounds or other Referrals:  Referred to Materal Fetal Medicine  Maternal Substance Abuse:  No Significant Maternal Medications:  None Significant Maternal Lab Results:  None Other Comments:  None  Review of Systems  Constitutional: Negative for chills and fever.  Eyes: Negative for blurred vision and double vision.  Cardiovascular: Negative for chest pain and palpitations.  Gastrointestinal: Negative for heartburn and nausea.   Maternal Medical History:  Reason for admission: Rupture of membranes.  Nausea.  Contractions: Onset was 3-5 hours ago.   Frequency: rare.   Perceived severity is mild.    Fetal activity: Perceived fetal activity is normal.   Last perceived fetal movement was within the past 12 hours.    Prenatal complications: Preterm labor.   Prenatal Complications -  Diabetes: none.      Blood pressure 113/73, pulse 112, temperature 97.7 F (36.5 C), temperature source Oral, resp. rate 18, height 5\' 6"  (1.676 m), weight 91.2 kg (201 lb), last menstrual period 06/25/2015. Maternal Exam:  Abdomen: Patient reports no abdominal tenderness. Fundal height is 33cm.    Introitus: Ferning test: positive.  Nitrazine test: positive. Amniotic fluid character: clear.  Cervix: Evaluated by NP in MAU: visibly closed  Fetal Exam Fetal Monitor Review: Baseline rate: 150.  Variability: moderate (6-25 bpm).   Pattern: accelerations present and no decelerations.    Fetal State Assessment: Category I - tracings are normal.     Physical Exam  Nursing note and vitals reviewed.   Prenatal labs: ABO, Rh: --/--/O POS (04/19 1644) Antibody:  neg Rubella:  Immune RPR:   NR HBsAg:   Neg HIV: Non Reactive (04/19 1644)  GBS:   Unknown  Assessment/Plan: 40 yo [id number] at 33 weeks 2 days with PPROM. No painful, or regular ctx.  All of patient's other deliveries were full term vaginal deliveries.  Will admit to antepartum service IV fluids IV latency antibiotics.  IM Betamethasone for [facility] [place] for growth and presentation.  Continuous monitoring.  Will swab for GBS Expectant management.    Janet Decesare, [place] 01/26/2016, 2:54 PM

## 2016-01-27 MED ORDER — LACTATED RINGERS IV BOLUS (SEPSIS)
500.0000 mL | Freq: Once | INTRAVENOUS | Status: AC
  Administered 2016-01-27: 500 mL via INTRAVENOUS

## 2016-01-27 NOTE — Progress Notes (Signed)
HD #2 Pt has no complaints. Still with fluid per vagina. Normal FM. Due for second betamethasone. She has no ctxs/good FM/no vaginal bleeding VSSAF Imp/ IUP at 33+ wks with PPROM Plan/ Continue as planned per Dr. Alwyn Pea

## 2016-01-27 NOTE — Consult Note (Signed)
[facility] 01/27/2016    10:32 PM  Neonatal Medicine Consultation         HINDA KOSTIUK          MRN:  [id number]  I was called at the request of the patient's obstetrician (Dr. Alwyn Pea) to speak to this patient due to potential premature birth as early as 42 weeks.  The patient's prenatal course includes PPROM yesterday at 89 2/7 weeks.  She is admitted to antenatal unit, and is receiving treatment that includes antibiotics and a course of betamethasone.  I reviewed expectations for a baby born at 33+ weeks, including survival, length of stay, morbidities such as respiratory distress, IVH, infection, feeding intolerance, retinopathy.  I described how we provide respiratory and feeding support.  Mom plans to breast feed, which I encouraged as best for the baby (with supplementations for needed calories).  I let mom know that the baby's outlook generally improves the longer she remains undelivered.  I spent 20 minutes reviewing the record, speaking to the patient, and entering appropriate documentation.  More than 50% of the time was spent face to face with patient.   _____________________ Electronically Signed By: Roosevelt Locks, MD Neonatologist

## 2016-01-28 LAB — CULTURE, BETA STREP (GROUP B ONLY)

## 2016-01-28 NOTE — Progress Notes (Signed)
Pt reports increased pressure and decrease leaking

## 2016-01-28 NOTE — Progress Notes (Signed)
Late entry from visit in am: HD#3 PPROM  Pt denies fever, abd tenderness, foul discharge, vaginal bleeding or contractions.  She does report good FM, continued LOF.  No complaints.  Vitals:   01/28/16 0451 01/28/16 0838 01/28/16 1200 01/28/16 1633  BP: 114/67  110/66 106/77  Pulse: 94  94 89  Resp: 18 18 18 18   Temp: 97.7 F (36.5 C) 97.8 F (36.6 C) 98.2 F (36.8 C) 97.7 F (36.5 C)  TempSrc: Oral Oral Oral Oral  SpO2:      Weight:      Height:         Lab Results  Component Value Date   WBC 8.7 01/26/2016   HGB 9.8 (L) 01/26/2016   HCT 29.1 (L) 01/26/2016   MCV 87.4 01/26/2016   PLT 150 01/26/2016   A/P: PPROM 33.4 S/p IV abx, Day 1 po abx S/p beta x 2 S/p NICU consultation Cont. Other current mgmt  Called by RN to report less fluid leaking and pressure felt by patient Bedside US performed by me - continued breech, pt aware of implications. Pt denies any strong pressure, or contractions and reports fluid intermittently leaking. Advised of precautions, if any changes RN aware to place pt on monitoring.    Allyn Kenner

## 2016-01-28 NOTE — Progress Notes (Signed)
Informed dr Rogue Bussing of decreased in leaking along with increase of vaginal pressure.

## 2016-01-28 NOTE — Progress Notes (Signed)
This note also relates to the following rows which could not be included: BP - Cannot attach notes to unvalidated device data Pulse Rate - Cannot attach notes to unvalidated device data  Pt states shes has change pads this morning, lg amt of clear fluid

## 2016-01-29 LAB — TYPE AND SCREEN
ABO/RH(D): O POS
ANTIBODY SCREEN: NEGATIVE

## 2016-01-29 NOTE — Progress Notes (Signed)
Patient ID: Jill Bright, female   DOB: Aug 24, 1975, 40 y.o.   MRN: [id number]   HD#4 PPROM & BREECH  S: Pt feeling well, she is without complaints. Endorses active fetal movement. Continues to leak clear fluid. Does note ocassional contractions, however these are no different then she was experiencing prior to PPROM. Denies bleeding or abdominal pain O: Vitals:   01/29/16 0500 01/29/16 0532 01/29/16 0537 01/29/16 0835  BP:  106/66  110/73  Pulse:  90 84 91  Resp: 20 18  16   Temp:  98 F (36.7 C)  98 F (36.7 C)  TempSrc:  Oral  Oral  SpO2:  100% 99%   Weight:      Height:       AOX3, NAD Abd Soft, NT/NT  A?P 1) PPROM, S/P BMZ x 2 2) Day 2 oral antibiotics 3) Constipation, will start miralax 4) SCDs for DVT prophylaxis

## 2016-01-30 ENCOUNTER — Inpatient Hospital Stay (HOSPITAL_COMMUNITY): Payer: Medicaid Other

## 2016-01-30 NOTE — Progress Notes (Signed)
Name: Jill Bright Medical Record Number:  [id number] Date of Birth: Sep 21, 1975 Date of Service: 01/30/2016  40 y.o. [id number] [redacted]w[redacted]d HD#4 admitted for PPROM at 33 weeks.   Pt currently stable. She denies contractions, no vaginal bleeding. She is continually leaking clear fluid.  She reports normal fetal movement.    The patient's past medical history and prenatal records were reviewed.  Additional issues addressed and updated today: Patient Active Problem List   Diagnosis Date Noted  . Preterm premature rupture of membranes in third trimester 01/26/2016  . Normal IUP (intrauterine pregnancy) on prenatal ultrasound 01/30/2012  . Corpus luteum cyst 01/30/2012   Family History  Problem Relation Age of Onset  . Diabetes Mother   . Hypertension Mother    Social History   Social History  . Marital status: Single    Spouse name: N/A  . Number of children: N/A  . Years of education: N/A   Social History Main Topics  . Smoking status: Light Tobacco Smoker    Packs/day: 0.25    Years: 4.00    Types: Cigarettes  . Smokeless tobacco: Never Used     Comment: cutting back 2/day for the past year  . Alcohol use No  . Drug use: No     Comment: prior to +preg  . Sexual activity: Yes    Birth control/ protection: Condom   Other Topics Concern  . None   Social History Narrative  . None   Vitals:   01/30/16 0856 01/30/16 0905  BP: 96/68 103/61  Pulse: 93 83  Resp:  17  Temp:  98.2 F (36.8 C)     Physical Examination:   Vitals:   01/30/16 0856 01/30/16 0905  BP: 96/68 103/61  Pulse: 93 83  Resp:  17  Temp:  98.2 F (36.8 C)   General appearance - alert, well appearing, and in no distress and oriented to person, place, and time Mental status - alert, oriented to person, place, and time, normal mood, behavior, speech, dress, motor activity, and thought processes Abd  Soft, gravid, nontender Ex SCDs FHTs  150s, moderate variability accels no decels Toco   None  Cervix: not evaluated  No results found for this or any previous visit (from the past 24 hour(s)).  A:  HD#4  [redacted]w[redacted]d with PPROM.  P: Discussed case this morning with Dr. Marcene Duos from MFM: Recommended delivery tomorrow at 34 weeks.  Will get f/u ultrasound for presentation now.  Discussed with patient that if baby is still breech, will perform a primary cesarean section. If vertex will start induction of labor tomorrow.  She is on latency antibiotic Steroid complete for FLM.   Seamus Warehime STACIA

## 2016-01-30 NOTE — Progress Notes (Signed)
U/s Tech at [facility] for fetal position

## 2016-01-31 ENCOUNTER — Inpatient Hospital Stay (HOSPITAL_COMMUNITY): Payer: Medicaid Other | Admitting: Certified Registered Nurse Anesthetist

## 2016-01-31 ENCOUNTER — Encounter (HOSPITAL_COMMUNITY)
Admission: AD | Disposition: A | Discharge status: Home / Self Care | Payer: Self-pay | Source: Ambulatory Visit | Attending: Obstetrics & Gynecology

## 2016-01-31 ENCOUNTER — Encounter (HOSPITAL_COMMUNITY): Payer: Self-pay | Admitting: Anesthesiology

## 2016-01-31 LAB — CBC
HEMATOCRIT: 27.9 % — AB (ref 36.0–46.0)
HEMOGLOBIN: 9.3 g/dL — AB (ref 12.0–15.0)
MCH: 29.3 pg (ref 26.0–34.0)
MCHC: 33.3 g/dL (ref 30.0–36.0)
MCV: 88 fL (ref 78.0–100.0)
Platelets: 140 10*3/uL — ABNORMAL LOW (ref 150–400)
RBC: 3.17 MIL/uL — ABNORMAL LOW (ref 3.87–5.11)
RDW: 13.9 % (ref 11.5–15.5)
WBC: 9.4 10*3/uL (ref 4.0–10.5)

## 2016-01-31 SURGERY — Surgical Case
Anesthesia: Spinal

## 2016-01-31 MED ORDER — PRENATAL MULTIVITAMIN CH
1.0000 | ORAL_TABLET | Freq: Every day | ORAL | Status: DC
  Administered 2016-02-01 – 2016-02-03 (×3): 1 via ORAL
  Filled 2016-01-31 (×3): qty 1

## 2016-01-31 MED ORDER — OXYTOCIN 10 UNIT/ML IJ SOLN
INTRAMUSCULAR | Status: AC
  Filled 2016-01-31: qty 4

## 2016-01-31 MED ORDER — DIBUCAINE 1 % RE OINT: 1.0000 "application " | TOPICAL_OINTMENT | RECTAL | Status: DC | PRN

## 2016-01-31 MED ORDER — NALOXONE HCL 2 MG/2ML IJ SOSY
1.0000 ug/kg/h | PREFILLED_SYRINGE | INTRAVENOUS | Status: DC | PRN
  Filled 2016-01-31: qty 2

## 2016-01-31 MED ORDER — SIMETHICONE 80 MG PO CHEW
80.0000 mg | CHEWABLE_TABLET | Freq: Three times a day (TID) | ORAL | Status: DC
  Administered 2016-01-31 – 2016-02-03 (×7): 80 mg via ORAL
  Filled 2016-01-31 (×10): qty 1

## 2016-01-31 MED ORDER — KETOROLAC TROMETHAMINE 30 MG/ML IJ SOLN
INTRAMUSCULAR | Status: AC
  Filled 2016-01-31: qty 1

## 2016-01-31 MED ORDER — IBUPROFEN 600 MG PO TABS
600.0000 mg | ORAL_TABLET | Freq: Four times a day (QID) | ORAL | Status: DC
  Administered 2016-01-31 – 2016-02-03 (×12): 600 mg via ORAL
  Filled 2016-01-31 (×12): qty 1

## 2016-01-31 MED ORDER — NALBUPHINE HCL 10 MG/ML IJ SOLN: 5.0000 mg | INTRAMUSCULAR | Status: DC | PRN

## 2016-01-31 MED ORDER — DIPHENHYDRAMINE HCL 50 MG/ML IJ SOLN
INTRAMUSCULAR | Status: AC
  Filled 2016-01-31: qty 1

## 2016-01-31 MED ORDER — NALBUPHINE HCL 10 MG/ML IJ SOLN: 5.0000 mg | Freq: Once | INTRAMUSCULAR | Status: DC | PRN

## 2016-01-31 MED ORDER — SOD CITRATE-CITRIC ACID 500-334 MG/5ML PO SOLN
ORAL | Status: AC
  Administered 2016-01-31: 30 mL
  Filled 2016-01-31: qty 15

## 2016-01-31 MED ORDER — LACTATED RINGERS IV SOLN
INTRAVENOUS | Status: DC | PRN
  Administered 2016-01-31 (×3): via INTRAVENOUS

## 2016-01-31 MED ORDER — DIPHENHYDRAMINE HCL 50 MG/ML IJ SOLN
12.5000 mg | INTRAMUSCULAR | Status: DC | PRN
  Administered 2016-01-31: 12.5 mg via INTRAVENOUS

## 2016-01-31 MED ORDER — CEFAZOLIN SODIUM-DEXTROSE 2-4 GM/100ML-% IV SOLN: 2.0000 g | Freq: Once | INTRAVENOUS | Status: DC

## 2016-01-31 MED ORDER — WITCH HAZEL-GLYCERIN EX PADS: 1.0000 "application " | MEDICATED_PAD | CUTANEOUS | Status: DC | PRN

## 2016-01-31 MED ORDER — OXYCODONE-ACETAMINOPHEN 5-325 MG PO TABS
1.0000 | ORAL_TABLET | ORAL | Status: DC | PRN
  Filled 2016-01-31 (×2): qty 1

## 2016-01-31 MED ORDER — FENTANYL CITRATE (PF) 100 MCG/2ML IJ SOLN
INTRAMUSCULAR | Status: AC
  Filled 2016-01-31: qty 2

## 2016-01-31 MED ORDER — PHENYLEPHRINE 8 MG IN D5W 100 ML (0.08MG/ML) PREMIX OPTIME
INJECTION | INTRAVENOUS | Status: DC | PRN
  Administered 2016-01-31: 60 ug/min via INTRAVENOUS

## 2016-01-31 MED ORDER — BUPIVACAINE HCL (PF) 0.25 % IJ SOLN
INTRAMUSCULAR | Status: AC
  Filled 2016-01-31: qty 10

## 2016-01-31 MED ORDER — TETANUS-DIPHTH-ACELL PERTUSSIS 5-2.5-18.5 LF-MCG/0.5 IM SUSP
0.5000 mL | Freq: Once | INTRAMUSCULAR | Status: AC
  Administered 2016-01-31: 0.5 mL via INTRAMUSCULAR

## 2016-01-31 MED ORDER — MORPHINE SULFATE-NACL 0.5-0.9 MG/ML-% IV SOSY
PREFILLED_SYRINGE | INTRAVENOUS | Status: AC
  Filled 2016-01-31: qty 1

## 2016-01-31 MED ORDER — PHENYLEPHRINE 8 MG IN D5W 100 ML (0.08MG/ML) PREMIX OPTIME
INJECTION | INTRAVENOUS | Status: AC
  Filled 2016-01-31: qty 100

## 2016-01-31 MED ORDER — SIMETHICONE 80 MG PO CHEW
80.0000 mg | CHEWABLE_TABLET | ORAL | Status: DC
  Administered 2016-01-31 – 2016-02-02 (×3): 80 mg via ORAL
  Filled 2016-01-31 (×3): qty 1

## 2016-01-31 MED ORDER — NALOXONE HCL 0.4 MG/ML IJ SOLN: 0.4000 mg | INTRAMUSCULAR | Status: DC | PRN

## 2016-01-31 MED ORDER — ONDANSETRON HCL 4 MG/2ML IJ SOLN
INTRAMUSCULAR | Status: DC | PRN
Start: 2016-01-31 — End: 2016-01-31
  Administered 2016-01-31: 4 mg via INTRAVENOUS

## 2016-01-31 MED ORDER — BUPIVACAINE HCL (PF) 0.25 % IJ SOLN
INTRAMUSCULAR | Status: DC | PRN
  Administered 2016-01-31 (×2): 10 mL

## 2016-01-31 MED ORDER — ACETAMINOPHEN 325 MG PO TABS: 650.0000 mg | ORAL_TABLET | ORAL | Status: DC | PRN

## 2016-01-31 MED ORDER — DIPHENHYDRAMINE HCL 25 MG PO CAPS
25.0000 mg | ORAL_CAPSULE | Freq: Four times a day (QID) | ORAL | Status: DC | PRN
Start: 2016-01-31 — End: 2016-02-03

## 2016-01-31 MED ORDER — OXYTOCIN 40 UNITS IN LACTATED RINGERS INFUSION - SIMPLE MED: 2.5000 [IU]/h | INTRAVENOUS | Status: DC

## 2016-01-31 MED ORDER — COCONUT OIL OIL
1.0000 "application " | TOPICAL_OIL | Status: DC | PRN
  Filled 2016-01-31: qty 120

## 2016-01-31 MED ORDER — FENTANYL CITRATE (PF) 100 MCG/2ML IJ SOLN
INTRAMUSCULAR | Status: DC | PRN
  Administered 2016-01-31: 10 ug via INTRATHECAL

## 2016-01-31 MED ORDER — LACTATED RINGERS IV SOLN: INTRAVENOUS | Status: DC

## 2016-01-31 MED ORDER — CEFAZOLIN SODIUM-DEXTROSE 2-3 GM-% IV SOLR
INTRAVENOUS | Status: DC | PRN
  Administered 2016-01-31: 2 g via INTRAVENOUS

## 2016-01-31 MED ORDER — OXYTOCIN 10 UNIT/ML IJ SOLN
INTRAMUSCULAR | Status: DC | PRN
  Administered 2016-01-31: 40 [IU] via INTRAVENOUS

## 2016-01-31 MED ORDER — MEPERIDINE HCL 25 MG/ML IJ SOLN
6.2500 mg | INTRAMUSCULAR | Status: DC | PRN
  Administered 2016-01-31: 6.25 mg via INTRAVENOUS

## 2016-01-31 MED ORDER — SIMETHICONE 80 MG PO CHEW: 80.0000 mg | CHEWABLE_TABLET | ORAL | Status: DC | PRN

## 2016-01-31 MED ORDER — MEPERIDINE HCL 25 MG/ML IJ SOLN
INTRAMUSCULAR | Status: AC
  Filled 2016-01-31: qty 1

## 2016-01-31 MED ORDER — FENTANYL CITRATE (PF) 100 MCG/2ML IJ SOLN
25.0000 ug | INTRAMUSCULAR | Status: DC | PRN
  Administered 2016-01-31: 50 ug via INTRAVENOUS

## 2016-01-31 MED ORDER — DIPHENHYDRAMINE HCL 25 MG PO CAPS
25.0000 mg | ORAL_CAPSULE | ORAL | Status: DC | PRN
  Filled 2016-01-31: qty 1

## 2016-01-31 MED ORDER — OXYCODONE-ACETAMINOPHEN 5-325 MG PO TABS
2.0000 | ORAL_TABLET | ORAL | Status: DC | PRN
  Administered 2016-02-01 – 2016-02-03 (×5): 2 via ORAL
  Filled 2016-01-31 (×4): qty 2

## 2016-01-31 MED ORDER — SENNOSIDES-DOCUSATE SODIUM 8.6-50 MG PO TABS
2.0000 | ORAL_TABLET | ORAL | Status: DC
  Administered 2016-01-31 – 2016-02-02 (×3): 2 via ORAL
  Filled 2016-01-31 (×3): qty 2

## 2016-01-31 MED ORDER — MENTHOL 3 MG MT LOZG: 1.0000 | LOZENGE | OROMUCOSAL | Status: DC | PRN

## 2016-01-31 MED ORDER — MORPHINE SULFATE (PF) 0.5 MG/ML IJ SOLN
INTRAMUSCULAR | Status: DC | PRN
Start: 2016-01-31 — End: 2016-01-31
  Administered 2016-01-31: .2 mg via INTRATHECAL

## 2016-01-31 MED ORDER — SODIUM CHLORIDE 0.9% FLUSH: 3.0000 mL | INTRAVENOUS | Status: DC | PRN

## 2016-01-31 MED ORDER — ZOLPIDEM TARTRATE 5 MG PO TABS: 5.0000 mg | ORAL_TABLET | Freq: Every evening | ORAL | Status: DC | PRN

## 2016-01-31 MED ORDER — PROMETHAZINE HCL 25 MG/ML IJ SOLN: 6.2500 mg | INTRAMUSCULAR | Status: DC | PRN

## 2016-01-31 MED ORDER — ONDANSETRON HCL 4 MG/2ML IJ SOLN
4.0000 mg | Freq: Three times a day (TID) | INTRAMUSCULAR | Status: DC | PRN
  Administered 2016-01-31: 4 mg via INTRAVENOUS
  Filled 2016-01-31: qty 2

## 2016-01-31 MED ORDER — LACTATED RINGERS IV SOLN
INTRAVENOUS | Status: DC | PRN
  Administered 2016-01-31: 09:00:00 via INTRAVENOUS

## 2016-01-31 MED ORDER — ACETAMINOPHEN 500 MG PO TABS
1000.0000 mg | ORAL_TABLET | Freq: Four times a day (QID) | ORAL | Status: AC
  Administered 2016-01-31 – 2016-02-01 (×4): 1000 mg via ORAL
  Filled 2016-01-31 (×4): qty 2

## 2016-01-31 MED ORDER — ONDANSETRON HCL 4 MG/2ML IJ SOLN
INTRAMUSCULAR | Status: AC
  Filled 2016-01-31: qty 2

## 2016-01-31 MED ORDER — BUPIVACAINE IN DEXTROSE 0.75-8.25 % IT SOLN
INTRATHECAL | Status: DC | PRN
  Administered 2016-01-31: 1.6 mL via INTRATHECAL

## 2016-01-31 SURGICAL SUPPLY — 41 items
BARRIER ADHS 3X4 INTERCEED (GAUZE/BANDAGES/DRESSINGS) ×3 IMPLANT
BENZOIN TINCTURE PRP APPL 2/3 (GAUZE/BANDAGES/DRESSINGS) ×3 IMPLANT
CHLORAPREP W/TINT 26ML (MISCELLANEOUS) ×3 IMPLANT
CLAMP CORD UMBIL (MISCELLANEOUS) IMPLANT
CLOSURE STERI STRIP 1/2 X4 (GAUZE/BANDAGES/DRESSINGS) ×3 IMPLANT
CLOSURE WOUND 1/2 X4 (GAUZE/BANDAGES/DRESSINGS) ×2
CLOTH BEACON ORANGE TIMEOUT ST (SAFETY) ×3 IMPLANT
DECANTER SPIKE VIAL GLASS SM (MISCELLANEOUS) ×6 IMPLANT
DRSG OPSITE POSTOP 4X10 (GAUZE/BANDAGES/DRESSINGS) ×3 IMPLANT
ELECT REM PT RETURN 9FT ADLT (ELECTROSURGICAL) ×3
ELECTRODE REM PT RTRN 9FT ADLT (ELECTROSURGICAL) ×1 IMPLANT
EXTRACTOR VACUUM BELL STYLE (SUCTIONS) IMPLANT
EXTRACTOR VACUUM KIWI (MISCELLANEOUS) IMPLANT
GLOVE BIO SURGEON STRL SZ 6.5 (GLOVE) ×2 IMPLANT
GLOVE BIO SURGEONS STRL SZ 6.5 (GLOVE) ×1
GLOVE BIOGEL PI IND STRL 7.0 (GLOVE) ×2 IMPLANT
GLOVE BIOGEL PI INDICATOR 7.0 (GLOVE) ×4
GOWN STRL REUS W/TWL LRG LVL3 (GOWN DISPOSABLE) ×9 IMPLANT
KIT ABG SYR 3ML LUER SLIP (SYRINGE) IMPLANT
NEEDLE HYPO 22GX1.5 SAFETY (NEEDLE) IMPLANT
NEEDLE HYPO 25X5/8 SAFETYGLIDE (NEEDLE) ×3 IMPLANT
NS IRRIG 1000ML POUR BTL (IV SOLUTION) ×3 IMPLANT
PACK C SECTION WH (CUSTOM PROCEDURE TRAY) ×3 IMPLANT
PAD OB MATERNITY 4.3X12.25 (PERSONAL CARE ITEMS) ×3 IMPLANT
PENCIL SMOKE EVAC W/HOLSTER (ELECTROSURGICAL) ×3 IMPLANT
RTRCTR C-SECT PINK 25CM LRG (MISCELLANEOUS) ×3 IMPLANT
SPONGE LAP 18X18 X RAY DECT (DISPOSABLE) ×3 IMPLANT
STRIP CLOSURE SKIN 1/2X4 (GAUZE/BANDAGES/DRESSINGS) ×4 IMPLANT
SUT CHROMIC 2 0 CT 1 (SUTURE) ×6 IMPLANT
SUT MNCRL 0 VIOLET CTX 36 (SUTURE) ×3 IMPLANT
SUT MONOCRYL 0 CTX 36 (SUTURE) ×6
SUT PDS AB 0 CTX 36 PDP370T (SUTURE) IMPLANT
SUT PLAIN 2 0 (SUTURE) ×2
SUT PLAIN ABS 2-0 CT1 27XMFL (SUTURE) ×1 IMPLANT
SUT VIC AB 0 CTX 36 (SUTURE) ×4
SUT VIC AB 0 CTX36XBRD ANBCTRL (SUTURE) ×2 IMPLANT
SUT VIC AB 4-0 KS 27 (SUTURE) ×3 IMPLANT
SYR CONTROL 10ML LL (SYRINGE) IMPLANT
SYR KIT LINE DRAW 1CC W/FILTR (LINER) ×3 IMPLANT
TOWEL OR 17X24 6PK STRL BLUE (TOWEL DISPOSABLE) ×3 IMPLANT
TRAY FOLEY CATH SILVER 14FR (SET/KITS/TRAYS/PACK) ×3 IMPLANT

## 2016-01-31 NOTE — Progress Notes (Signed)
Pt received to floor from PACU in stable condition post c/section. Pt without complaints and denies having any pain at this time. SCD's on. Pitocin infusing at 62.5. Fundus is firm @U  with scant bleeding. No concerns noted at this time. Admission assessment complete. [place]

## 2016-01-31 NOTE — Progress Notes (Signed)
Anesthesia at bedside

## 2016-01-31 NOTE — Anesthesia Procedure Notes (Signed)
Spinal  Patient location during procedure: OR Staffing Anesthesiologist: TURK, STEPHEN EDWARD Performed: anesthesiologist  Preanesthetic Checklist Completed: patient identified, surgical consent, pre-op evaluation, timeout performed, IV checked, risks and benefits discussed and monitors and equipment checked Spinal Block Patient position: sitting Prep: site prepped and draped and DuraPrep Patient monitoring: continuous pulse ox and blood pressure Approach: midline Location: L3-4 Injection technique: single-shot Needle Needle type: Pencan  Needle gauge: 24 G Needle length: 9 cm Assessment Sensory level: T4 Additional Notes Functioning IV was confirmed and monitors were applied. Sterile prep and drape, including hand hygiene, mask and sterile gloves were used. The patient was positioned and the spine was prepped. The skin was anesthetized with lidocaine.  Free flow of clear CSF was obtained prior to injecting local anesthetic into the CSF.  The spinal needle aspirated freely following injection.  The needle was carefully withdrawn.  The patient tolerated the procedure well. Consent was obtained prior to procedure with all questions answered and concerns addressed. Risks including but not limited to bleeding, infection, nerve damage, paralysis, failed block, inadequate analgesia, allergic reaction, high spinal, itching and headache were discussed and the patient wished to proceed.   Stephen Turk, MD     

## 2016-01-31 NOTE — Anesthesia Preprocedure Evaluation (Signed)
Anesthesia Evaluation  Patient identified by MRN, date of birth, ID band Patient awake    Reviewed: Allergy & Precautions, NPO status , Patient's Chart, lab work & pertinent test results  History of Anesthesia Complications Negative for: history of anesthetic complications  Airway Mallampati: II  TM Distance: >3 FB Neck ROM: Full    Dental  (+) Teeth Intact, Dental Advisory Given   Pulmonary Current Smoker,    Pulmonary exam normal breath sounds clear to auscultation       Cardiovascular negative cardio ROS Normal cardiovascular exam Rhythm:Regular Rate:Normal     Neuro/Psych negative neurological ROS  negative psych ROS   GI/Hepatic negative GI ROS, Neg liver ROS,   Endo/Other  Obesity   Renal/GU negative Renal ROS     Musculoskeletal negative musculoskeletal ROS (+)   Abdominal   Peds  Hematology negative hematology ROS (+)   Anesthesia Other Findings Day of surgery medications reviewed with the patient.  Reproductive/Obstetrics (+) Pregnancy 34 week PPROM Breech                            Anesthesia Physical Anesthesia Plan  ASA: II  Anesthesia Plan: Spinal   Post-op Pain Management:    Induction:   Airway Management Planned:   Additional Equipment:   Intra-op Plan:   Post-operative Plan:   Informed Consent: I have reviewed the patients History and Physical, chart, labs and discussed the procedure including the risks, benefits and alternatives for the proposed anesthesia with the patient or authorized representative who has indicated his/her understanding and acceptance.   Dental advisory given  Plan Discussed with: CRNA, Anesthesiologist and Surgeon  Anesthesia Plan Comments: (Discussed risks and benefits of and differences between spinal and general. Discussed risks of spinal including headache, backache, failure, bleeding, infection, and nerve damage. Patient  consents to spinal. Questions answered. Coagulation studies and platelet count acceptable.)        Anesthesia Quick Evaluation

## 2016-01-31 NOTE — Progress Notes (Signed)
Preoperative Note  40 yo G9P3 at 34 weeks PPROM. She is s/p betamethasone for FLM and latency antibiotics.  As per discussion with MFM, recommendation is delivery today to prevent morbidity ie infection etc.   Baby is Johnney Ou with oligohydramnios, therefore we will proceed with a primary low transverse cesarean section.   Discussed with patient risks of procedure and she voiced understanding and agrees to proceed.   Jill Bright STACIA

## 2016-01-31 NOTE — Op Note (Signed)
Cesarean Section Procedure Note  Indications: 34 week PPPROM breech presentation  Pre-operative Diagnosis: 34 week 0 day pregnancy, PPROM Frank breech presentation  Post-operative Diagnosis: same  Procedure:  Primary cesarean delivery                          Surgeon: Caffie Damme, MD  Assistants: None  Anesthesia: Spinal anesthesia  ASA Class: 2  Procedure Details   The patient was counseled about the risks, benefits, complications of the cesarean section. The patient concurred with the proposed plan, giving informed consent.  The site of surgery properly noted/marked. The patient was taken to Operating Room # 9, identified as Jill Bright and the procedure verified as C-Section Delivery. A Time Out was held and the above information confirmed.  After spinal was found to adequate , the patient was placed in the dorsal supine position with a leftward tilt, draped and prepped in the usual sterile manner. A Pfannenstiel incision was made and carried down through the subcutaneous tissue to the fascia.  The fascia was incised in the midline and the fascial incision was extended laterally with Mayo scissors. The superior aspect of the fascial incision was grasped with Coker clamps x2, tented up and the rectus muscles dissected off sharply with the bovie.  The rectus was then dissected off with blunt dissection and Mayo scissors inferiorly. The rectus muscles were separated in the midline. The abdominal peritoneum was identified, tented up, entered sharply using Metzenbaum scissors, and the incision was extended superiorly and inferiorly with good visualization of the bladder. The Alexis retractor was then deployed. The vesicouterine peritoneum was identified, tented up, entered sharply with Metzenbaum scissors, and the bladder flap was created digitally. Scalpel was then used to make a low transverse incision on the uterus which was extended laterally with  blunt dissection. The fetal  breech was identified, each leg was delivered  The body was delivered until the shoulders.  Each arm was delivered via Pinard maneuver. The head was delivered via Demaris Callander, [place] maneuver. The a live female infant was bulb suctioned on the operative field cried vigorously, cord was clamped and cut and the infant was passed to the waiting neonatologist. Apgars 8/9. Placenta was then delivered spontaneously, intact and appear normal, the uterus was cleared of all clot and debris.The uterus was delivered from the abdomen. The uterine incision was repaired with #0 Monocryl in running locked fashion. A second imbricating suture was performed using the same suture. The incision was oozing in the middle so interrupted sutures (figure of eight) of 0-Monocryl was used to obtain hemostasis. Uterus with small fibroids. Ovaries and tubes were inspected and normal.  The uterus was replaced in the abdomen. Exparel was placed on each tube. The Alexis retractor was removed. The abdominal cavity was cleared of all clot and debris. The abdominal peritoneum was reapproximated with 2-0 chromic  in a running fashion, the rectus muscles was reapproximated with #2 chromic in interrupted fashion. The fascia was closed with 0 Vicryl in a running fashion. The subcuticular layer was irrigated and all bleeders cauterized.  20 mL of 0.25% Marcaine was injected into the subcutaneous layer.  The Scarpas fascia was re-approximated with interrupted sutures of 2-0 plain. The skin was closed with 4-0 vicryl in a subcuticular fashion using a Lanny Hurst needle. The incision was dressed with benzoine, steri strips and pressure dressing. All sponge lap and needle counts were correct x3.   Patient tolerated the procedure  well and recovered in stable condition following the procedure.  Instrument, sponge, and needle counts were correct prior the abdominal closure and at the conclusion of the case.   Findings: Live female infant, Apgars 9/10,  clear scant amniotic fluid, normal appearing placenta, fibroid uterus, bilateral tubes and ovaries  Estimated Blood Loss: 678mL  IVF:  2900 mL LR         Drains: Foley catheter  Urine output: 300 mL clear         Specimens: Placenta to Pathology; cord gases, Cord blood         Implants: none         Complications:  None; patient tolerated the procedure well.         Disposition: PACU - hemodynamically stable.   Jill Bright

## 2016-01-31 NOTE — Transfer of Care (Signed)
Immediate Anesthesia Transfer of Care Note  Patient: Jill Bright  Procedure(s) Performed: Procedure(s): CESAREAN SECTION (N/A)  Patient Location: PACU  Anesthesia Type:Spinal  Level of Consciousness: awake and alert   Airway & Oxygen Therapy: Patient Spontanous Breathing  Post-op Assessment: Report given to RN and Post -op Vital signs reviewed and stable  Post vital signs: Reviewed  Last Vitals:  Vitals:   01/31/16 0230 01/31/16 0744  BP:  103/70  Pulse:  81  Resp: 14 16  Temp:  36.6 C    Last Pain:  Vitals:   01/31/16 0744  TempSrc: Oral  PainSc: 0-No pain      Patients Stated Pain Goal: 0 ([id number])  Complications: No apparent anesthesia complications

## 2016-01-31 NOTE — Progress Notes (Signed)
Pt OOB to bathroom. Pericare performed. Pt ambulating in hallway. IV saline locked and Foley d/c'd. 800cc of clear, yellow urine noted. Toya Smothers, RN

## 2016-01-31 NOTE — Anesthesia Postprocedure Evaluation (Signed)
Anesthesia Post Note  Patient: Jill Bright  Procedure(s) Performed: Procedure(s) (LRB): CESAREAN SECTION (N/A)  Patient location during evaluation: [facility] Anesthesia Type: Spinal Level of consciousness: awake Pain management: pain level controlled Vital Signs Assessment: post-procedure vital signs reviewed and stable Respiratory status: spontaneous breathing Cardiovascular status: stable Postop Assessment: no headache, no backache, spinal receding, patient able to bend at knees, no signs of nausea or vomiting and adequate PO intake Anesthetic complications: no     Last Vitals:  Vitals:   01/31/16 1400 01/31/16 1500  BP: 112/70 127/74  Pulse: 69 64  Resp: 14 16  Temp:      Last Pain:  Vitals:   01/31/16 1300  TempSrc: Oral  PainSc: 1    Pain Goal: Patients Stated Pain Goal: 1 (01/31/16 1300)               Jannie Doyle

## 2016-01-31 NOTE — Addendum Note (Signed)
Addendum  created 01/31/16 1532 by Ignacia Bayley, CRNA   Sign clinical note

## 2016-01-31 NOTE — Anesthesia Postprocedure Evaluation (Signed)
Anesthesia Post Note  Patient: Jill Bright  Procedure(s) Performed: Procedure(s) (LRB): CESAREAN SECTION (N/A)  Patient location during evaluation: PACU Anesthesia Type: Spinal Level of consciousness: oriented and awake and alert Pain management: pain level controlled Vital Signs Assessment: post-procedure vital signs reviewed and stable Respiratory status: spontaneous breathing, respiratory function stable and patient connected to nasal cannula oxygen Cardiovascular status: blood pressure returned to baseline and stable Postop Assessment: no headache, no backache, spinal receding, no signs of nausea or vomiting and patient able to bend at knees Anesthetic complications: no     Last Vitals:  Vitals:   01/31/16 1146 01/31/16 1203  BP:  120/79  Pulse: 69 60  Resp: 16 14  Temp:  37 C    Last Pain:  Vitals:   01/31/16 1203  TempSrc: Oral  PainSc:    Pain Goal: Patients Stated Pain Goal: 0 (01/30/16 2008)               Catalina Gravel

## 2016-01-31 NOTE — Progress Notes (Signed)
Pt back from NICU. Breast pumping initiated. Patient without complaints at this time. Toya Smothers, RN

## 2016-02-01 ENCOUNTER — Encounter (HOSPITAL_COMMUNITY): Payer: Self-pay | Admitting: Obstetrics & Gynecology

## 2016-02-01 LAB — TYPE AND SCREEN
ABO/RH(D): O POS
ANTIBODY SCREEN: NEGATIVE

## 2016-02-01 LAB — CBC
HEMATOCRIT: 24.4 % — AB (ref 36.0–46.0)
Hemoglobin: 8.2 g/dL — ABNORMAL LOW (ref 12.0–15.0)
MCH: 29.4 pg (ref 26.0–34.0)
MCHC: 33.6 g/dL (ref 30.0–36.0)
MCV: 87.5 fL (ref 78.0–100.0)
Platelets: 138 10*3/uL — ABNORMAL LOW (ref 150–400)
RBC: 2.79 MIL/uL — ABNORMAL LOW (ref 3.87–5.11)
RDW: 13.7 % (ref 11.5–15.5)
WBC: 13 10*3/uL — AB (ref 4.0–10.5)

## 2016-02-01 NOTE — Plan of Care (Signed)
Problem: Life Cycle: Goal: Risk for postpartum hemorrhage will decrease Outcome: Completed/Met Date Met: 02/01/16 Vital signs within normal. Lochia small in amount, uterus firm and contracted. Goal: Chance of risk for complications during the postpartum period will decrease Outcome: Progressing Progressing well.  Problem: Role Relationship: Goal: Ability to demonstrate positive interaction with newborn will improve Outcome: Not Applicable Date Met: 84/16/60 Unable to assess at this time.  Baby in NICU.  Problem: Pain Management: Goal: General experience of comfort will improve and pain level will decrease Good pain control with analgesia of choice.  Problem: Bowel/Gastric: Goal: Gastrointestinal status will improve Outcome: Progressing Passing flatus.  Good bowel sounds. Tolerating diet well.   Problem: Urinary Elimination: Goal: Ability to reestablish a normal urinary elimination pattern will improve Outcome: Completed/Met Date Met: 02/01/16 Voiding  Without any problem.

## 2016-02-01 NOTE — Progress Notes (Signed)
Subjective: Postpartum Day 1: Cesarean Delivery Patient doing well. She has no complaints.  No fever, no Chills, no nausea or vomiting. She is pumping.     Objective: Vital signs in last 24 hours: Temp:  [97.5 F (36.4 C)-98.6 F (37 C)] 98.4 F (36.9 C) (10/22 0544) Pulse Rate:  [60-80] 67 (10/22 0544) Resp:  [11-20] 18 (10/22 0544) BP: (106-127)/(60-84) 106/81 (10/22 0813) SpO2:  [97 %-100 %] 100 % (10/22 0544)  Physical Exam:  General: alert, cooperative and no distress Lochia: appropriate Uterine Fundus: firm Incision: healing well, no significant drainage, slight clear drainage present DVT Evaluation: No evidence of DVT seen on physical exam. Negative Homan's sign. No cords or calf tenderness. No significant calf/ankle edema.   Recent Labs  01/31/16 0600 02/01/16 0507  HGB 9.3* 8.2*  HCT 27.9* 24.4*    Assessment/Plan: Status post Cesarean section. Doing well postoperatively.  Continue current care.  Sanjuana Kava STACIA 02/01/2016, 11:20 AM

## 2016-02-01 NOTE — Progress Notes (Signed)
Dressing changed per order. Incision is clean and well approximated. No concerns noted. Toya Smothers, RN

## 2016-02-01 NOTE — Plan of Care (Signed)
Problem: Pain Management: Goal: General experience of comfort will improve and pain level will decrease Outcome: Completed/Met Date Met: 02/01/16 Good pain control with analgesia.  Problem: Bowel/Gastric: Goal: Gastrointestinal status will improve Outcome: Completed/Met Date Met: 02/01/16 Positive bowel sounds.  Passing flatus.

## 2016-02-01 NOTE — Lactation Note (Addendum)
This note was copied from a baby's chart. Lactation Consultation Note  Patient Name: Jill Bright [id number] Date: 02/01/2016 Reason for consult: Initial assessment;NICU baby;Infant < 6lbs;Late preterm infant   Initial assessment for ist time BF mom of 46 hour old LPT NICU infant "[name]". Mom reports she wanted to formula feed but since infant is preterm she has decided to breast and formula feed. Her breasts are noted to be filling today. Showed her how to hand express and copious amounts of EBM noted. Mom was pleased. Praised mom for providing milk for her infant in NICU. Maternal history of GDM and HTN.   Enc mom to pump every 2-3 hours with DEBP on Initiate setting followed by hand expression. Mom reports she is getting small volumes of breast milk with pumping. Mom was informed of NICU pumping rooms. Mom is a [facility] client and does not have a pump at home, [facility] Referral faxed to [facility] office. Pump setting reviewed with mom.   Providing Milk for Your Baby in NICU Booklet given, reviewed pumping and what to expect, milk coming to volume and NICU Breat milk storage. Mom has bottles and BM labels., yellow stickers given.  [facility] Brochure given, mom informed of IP/OP Services, BF Support Groups and [place] phone #. 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   Follow up tomorrow and prn.    Maternal Data Formula Feeding for Exclusion: Yes Reason for exclusion: Mother's choice to formula feed on admision (Changed to BF/FO per mom for preterm infant.) Has patient been taught Hand Expression?: Yes Does the patient have breastfeeding experience prior to this delivery?: No (did not BF her older 3 children)  Feeding Feeding Type: Formula Nipple Type: Slow - flow Length of feed: 15 min  LATCH Score/Interventions                      Lactation Tools Discussed/Used WIC Program: Yes [facility]) Pump Review: Setup, frequency, and  cleaning;Milk Storage Initiated by:: Reviewed   Consult Status Consult Status: Follow-up Date: 02/02/16 Follow-up type: In-patient    [name] Hice 02/01/2016, 9:24 AM

## 2016-02-02 LAB — RPR: RPR Ser Ql: NONREACTIVE

## 2016-02-02 NOTE — Progress Notes (Signed)
Patient is doing well.  She is tolerating PO, ambulating, voiding.  Pain is controlled.  Lochia is appropriate  Vitals:   02/01/16 0813 02/01/16 1718 02/01/16 2154 02/02/16 0600  BP: 106/81 129/77 121/80 114/84  Pulse:  87 79 86  Resp:  17 18 18   Temp:  98.1 F (36.7 C) 98.8 F (37.1 C) 98.3 F (36.8 C)  TempSrc:  Oral Oral Oral  SpO2:  100% 100% 98%  Weight:      Height:        NAD Abdomen:  soft, appropriate tenderness, incisions intact and without erythema or drainage ext:    Symmetric, no edema bilaterally  Lab Results  Component Value Date   WBC 13.0 (H) 02/01/2016   HGB 8.2 (L) 02/01/2016   HCT 24.4 (L) 02/01/2016   MCV 87.5 02/01/2016   PLT 138 (L) 02/01/2016    --/--/O POS (10/22 0802)/RImmune  Assessment/Plan: [id number] POD#2 s/p primary c/s for breech presentation / PPROM at 34 wks  Doing well postoperatively.  Continue current care. Baby doing well in NICU.  Pumping  Astraea Gaughran GEFFEL Xzayvion Vaeth 02/02/2016, 9:45 AM

## 2016-02-02 NOTE — Progress Notes (Signed)
CSW acknowledges NICU admission.    Patient screened out for psychosocial assessment since none of the following apply:  Psychosocial stressors documented in mother or baby's chart  Gestation less than 32 weeks  Code at delivery   Infant with anomalies  Please contact the Clinical Social Worker if specific needs arise, or by MOB's request.       

## 2016-02-02 NOTE — Lactation Note (Addendum)
This note was copied from a baby's chart. Lactation Consultation Note  Patient Name: Jill Bright [id number] Date: 02/02/2016 Reason for consult: Follow-up assessment;NICU baby;Infant < 6lbs;Late preterm infant   Follow up with mom of 31 hour old NICU infant. Mom reports she is pumping every 3 hours and is filling up Colostrum collection Containers with each pumping and taking to NICU. She asked to be shown how to hand express again. Assisted mom in hand expressing and she was able to see milk flow. Mom is pleased. Mom is to pump and we will again work on Hand Expression. Mom pleased to see milk. Enc mom to call with questions/concerns prn. Follow up tomorrow and prn.   Mom pumped about 20 cc and is hand expressing colostrum successfully. Mom reports she has a [facility] Appt tomorrow afternoon at 1:30 to pick up her pump.    Maternal Data Has patient been taught Hand Expression?: Yes (Reviewed) Does the patient have breastfeeding experience prior to this delivery?: No  Feeding Feeding Type: Breast Milk with Formula added Nipple Type: Slow - flow Length of feed: 20 min  LATCH Score/Interventions                      Lactation Tools Discussed/Used WIC Program: Yes Pump Review: Setup, frequency, and cleaning;Milk Storage   Consult Status Consult Status: Follow-up Date: 02/03/16 Follow-up type: In-patient    [name] [name] 02/02/2016, 2:03 PM

## 2016-02-03 ENCOUNTER — Encounter (HOSPITAL_COMMUNITY): Payer: Self-pay | Admitting: *Deleted

## 2016-02-03 MED ORDER — OXYCODONE-ACETAMINOPHEN 5-325 MG PO TABS: 2.0000 | ORAL_TABLET | ORAL | 0 refills | Status: DC | PRN

## 2016-02-03 NOTE — Lactation Note (Signed)
This note was copied from a baby's chart. Lactation Consultation Note  Patient Name: Girl Dharma Mccone [id number] Date: 02/03/2016 Reason for consult: Follow-up assessment;NICU baby;Infant < 6lbs;Late preterm infant   Follow up with mom of 53 hour old NICU infant. Mom reports her milk volume was increased this morning and breasts are full/ She was getting ready to pump. Left breast is soft, right breast in noted to be lumpy. Enc mom to massage breast before and during pumping. Engorgement prevention/treatment reviewed.   Enc mom to continue pumping every 2-3 hours and to use pumps when in NICU. Mom has a WIC appt at 1:30 pm today. Enc mom to transport milk on ice to NICU. Enc mom to call when she is ready to put infant to breast and with questions/concerns. Follow up prn.   Maternal Data    Feeding Feeding Type: Formula Nipple Type: Slow - flow Length of feed: 40 min (30"PO/10"NG)  LATCH Score/Interventions                      Lactation Tools Discussed/Used WIC Program: Yes Pump Review: Setup, frequency, and cleaning;Milk Storage Initiated by:: Reviewed   Consult Status Consult Status: PRN Follow-up type: Call as needed    Donn Pierini 02/03/2016, 10:56 AM

## 2016-02-03 NOTE — Discharge Summary (Signed)
Obstetric Discharge Summary Reason for Admission: rupture of membranes, breech Prenatal Procedures: ultrasound Intrapartum Procedures: cesarean: low cervical, transverse Postpartum Procedures: none Complications-Operative and Postpartum: none Hemoglobin  Date Value Ref Range Status  02/01/2016 8.2 (L) 12.0 - 15.0 g/dL Final   HCT  Date Value Ref Range Status  02/01/2016 24.4 (L) 36.0 - 46.0 % Final    Physical Exam:  General: alert and cooperative Lochia: appropriate Uterine Fundus: firm Incision: healing well, no significant drainage DVT Evaluation: No evidence of DVT seen on physical exam.  Discharge Diagnoses: PROM xsicne 01/26/2016 hours  Discharge Information: Date: 02/03/2016 Activity: pelvic rest Diet: routine Medications: PNV, Ibuprofen and Percocet Condition: stable Instructions: refer to practice specific booklet Discharge to: home Follow-up Information    PINN, Andrez Grime, MD Follow up in 2 week(s).   Specialty:  Obstetrics and Gynecology Contact information: [address] [place] [address] [phone]           Newborn Data: Live born female  Birth Weight: 3 lb 15.1 oz (1790 g) APGAR: 9, 10  Baby will remain in  NICU.  Allyn Kenner 02/03/2016, 10:31 AM

## 2016-02-11 ENCOUNTER — Ambulatory Visit: Payer: Self-pay

## 2016-02-11 NOTE — Lactation Note (Signed)
This note was copied from a baby's chart. Lactation Consultation Note  Patient Name: Jill Bright Today's Date: 02/11/2016  Follow up visit made with mom prior to baby's discharge.  Mom and baby roomed in last night.  Mom formula fed her previous babies but decided to provide breastmilk for this infant due to prematurity.  Mom obtained a pump from [facility] and she pumps 8 times/24 hours.  She has an abundant supply.  Praised mom for her efforts and reminded her of the benefits for both she and baby.  Mom does not wish to put baby to breast.  Lactation outpatient services and support information reviewed and encouraged.   Maternal Data    Feeding    LATCH Score/Interventions                      Lactation Tools Discussed/Used     Consult Status      Ave Filter 02/11/2016, 9:35 AM

## 2016-02-20 ENCOUNTER — Inpatient Hospital Stay (HOSPITAL_COMMUNITY)
Admission: AD | Admit: 2016-02-20 | Discharge: 2016-02-20 | Disposition: A | Discharge status: Home / Self Care | Payer: Medicaid Other | Source: Ambulatory Visit | Attending: Obstetrics and Gynecology | Admitting: Obstetrics and Gynecology

## 2016-02-20 ENCOUNTER — Encounter (HOSPITAL_COMMUNITY): Payer: Self-pay | Admitting: *Deleted

## 2016-02-20 DIAGNOSIS — O165 Unspecified maternal hypertension, complicating the puerperium: Secondary | ICD-10-CM

## 2016-02-20 DIAGNOSIS — R079 Chest pain, unspecified: Secondary | ICD-10-CM | POA: Insufficient documentation

## 2016-02-20 DIAGNOSIS — K2100 Gastro-esophageal reflux disease with esophagitis, without bleeding: Secondary | ICD-10-CM

## 2016-02-20 DIAGNOSIS — O9089 Other complications of the puerperium, not elsewhere classified: Secondary | ICD-10-CM | POA: Insufficient documentation

## 2016-02-20 LAB — CBC
HEMATOCRIT: 29.7 % — AB (ref 36.0–46.0)
Hemoglobin: 9.6 g/dL — ABNORMAL LOW (ref 12.0–15.0)
MCH: 27.9 pg (ref 26.0–34.0)
MCHC: 32.3 g/dL (ref 30.0–36.0)
MCV: 86.3 fL (ref 78.0–100.0)
Platelets: 331 10*3/uL (ref 150–400)
RBC: 3.44 MIL/uL — ABNORMAL LOW (ref 3.87–5.11)
RDW: 13.9 % (ref 11.5–15.5)
WBC: 6.2 10*3/uL (ref 4.0–10.5)

## 2016-02-20 LAB — URINALYSIS, ROUTINE W REFLEX MICROSCOPIC
BILIRUBIN URINE: NEGATIVE
Glucose, UA: NEGATIVE mg/dL
KETONES UR: NEGATIVE mg/dL
Leukocytes, UA: NEGATIVE
NITRITE: NEGATIVE
PROTEIN: NEGATIVE mg/dL
Specific Gravity, Urine: 1.025 (ref 1.005–1.030)
pH: 5.5 (ref 5.0–8.0)

## 2016-02-20 LAB — URINE MICROSCOPIC-ADD ON: Bacteria, UA: NONE SEEN

## 2016-02-20 LAB — COMPREHENSIVE METABOLIC PANEL
ALT: 17 U/L (ref 14–54)
ANION GAP: 5 (ref 5–15)
AST: 18 U/L (ref 15–41)
Albumin: 3.3 g/dL — ABNORMAL LOW (ref 3.5–5.0)
Alkaline Phosphatase: 73 U/L (ref 38–126)
BILIRUBIN TOTAL: 0.3 mg/dL (ref 0.3–1.2)
BUN: 13 mg/dL (ref 6–20)
CO2: 23 mmol/L (ref 22–32)
Calcium: 8.7 mg/dL — ABNORMAL LOW (ref 8.9–10.3)
Chloride: 107 mmol/L (ref 101–111)
Creatinine, Ser: 0.82 mg/dL (ref 0.44–1.00)
GFR calc Af Amer: >60 mL/min (ref >60)
Glucose, Bld: 83 mg/dL (ref 65–99)
POTASSIUM: 3.9 mmol/L (ref 3.5–5.1)
Sodium: 135 mmol/L (ref 135–145)
TOTAL PROTEIN: 6.5 g/dL (ref 6.5–8.1)

## 2016-02-20 LAB — PROTEIN / CREATININE RATIO, URINE
CREATININE, URINE: 230 mg/dL
PROTEIN CREATININE RATIO: 0.05 mg/mg{creat} (ref 0.00–0.15)
Total Protein, Urine: 11 mg/dL

## 2016-02-20 LAB — POCT PREGNANCY, URINE: PREG TEST UR: NEGATIVE

## 2016-02-20 MED ORDER — GI COCKTAIL ~~LOC~~
30.0000 mL | ORAL | Status: AC
  Administered 2016-02-20: 30 mL via ORAL
  Filled 2016-02-20: qty 30

## 2016-02-20 NOTE — MAU Note (Addendum)
C/S on 10/21. C/O light HA that started last night around 0100. Chest pain since 10/27. States it has gotten worse. Hurts worse when she lies down and tries to go to sleep. Hurts up into shoulder blades. Describes as heaviness that moves around to her throat area and feels like it is hard to breathe. Pain around incision. Has had F/U with MD. Was having itching around incision at that time. Now she feels pain and cramping. States bleeding is "normal."

## 2016-02-20 NOTE — MAU Provider Note (Signed)
Chief Complaint: Headache and Chest Pain   First Provider Initiated Contact with Patient 02/20/16 1351      SUBJECTIVE HPI: Jill Bright is a 40 y.o. [id number] s/p PLTCS 20 days ago at 45 weeks for PPROM breech presentation who presents to maternity admissions reporting chest pain that started while she was in the hospital 20 days ago but is worsening gradually. She tried to treat the chest pain as an early chest cold and drank herbal tea and plenty of water but that did not help. She tried to treat it as heartburn and took Tums but that did not help either. She reports the pain is constant, in her left upper chest and radiating into her throat, worse with lying down, not improved by anything.  She also reports onset of RLQ abdominal cramping pain 2 days ago. The pain feels like menstrual cramping and she thinks it may be her period since she has not had bleeding since her lochia stopped.  She has not tried any treatments for pain.  She denies h/a but reports she occasionally sees white floaters in her vision, and this also started ~20 days ago after she had her baby.  She was discharged from the hospital on POD #3 and her baby remained in the NICU for a short time after.  She is pumping breastmilk and her baby is doing well at home now. She denies vaginal bleeding, vaginal itching/burning, urinary symptoms, h/a, dizziness, n/v, or fever/chills.     HPI  Past Medical History:  Diagnosis Date  . Vaginal Pap smear, abnormal    LEEP, ok since   Past Surgical History:  Procedure Laterality Date  . CESAREAN SECTION N/A 01/31/2016   Procedure: CESAREAN SECTION;  Surgeon: Sanjuana Kava, MD;  Location: [place];  Service: Obstetrics;  Laterality: N/A;  . CHOLECYSTECTOMY    . DILATION AND CURETTAGE OF UTERUS    . THERAPEUTIC ABORTION     Social History   Social History  . Marital status: Single    Spouse name: N/A  . Number of children: N/A  . Years of education: N/A   Occupational  History  . Not on file.   Social History Main Topics  . Smoking status: Former Smoker    Packs/day: 0.25    Years: 4.00    Types: Cigarettes  . Smokeless tobacco: Never Used     Comment: cutting back 2/day for the past year  . Alcohol use No  . Drug use: No     Comment: prior to +preg  . Sexual activity: Yes    Birth control/ protection: Condom   Other Topics Concern  . Not on file   Social History Narrative  . No narrative on file   No current facility-administered medications on file prior to encounter.    Current Outpatient Prescriptions on File Prior to Encounter  Medication Sig Dispense Refill  . acetaminophen (TYLENOL) 500 MG tablet Take 1,000 mg by mouth every 6 (six) hours as needed for moderate pain.     Marland Kitchen oxyCODONE-acetaminophen (PERCOCET/ROXICET) 5-325 MG tablet Take 2 tablets by mouth every 4 (four) hours as needed (pain scale > 7). 30 tablet 0   Allergies  Allergen Reactions  . Tea Hives    ROS:  Review of Systems  Constitutional: Negative for chills, fatigue and fever.  Eyes: Positive for visual disturbance.  Respiratory: Negative for shortness of breath.   Cardiovascular: Positive for chest pain.  Gastrointestinal: Positive for abdominal pain. Negative for nausea  and vomiting.  Genitourinary: Negative for difficulty urinating, dysuria, flank pain, pelvic pain, vaginal bleeding, vaginal discharge and vaginal pain.  Neurological: Negative for dizziness and headaches.  Psychiatric/Behavioral: Negative.      I have reviewed patient's Past Medical Hx, Surgical Hx, Family Hx, Social Hx, medications and allergies.   Physical Exam  Patient Vitals for the past 24 hrs:  BP Temp Temp src Pulse Resp SpO2  02/20/16 1343 144/88 - - 81 - -  02/20/16 1329 139/96 - - - - -  02/20/16 1328 139/90 - - 82 - -  02/20/16 1320 133/89 - - 87 - -  02/20/16 1312 148/91 98.1 F (36.7 C) Oral 89 18 100 %   Constitutional: Well-developed, well-nourished female in no acute  distress.  HEART: normal rate, heart sounds, regular rhythm RESP: normal effort, lung sounds clear and equal bilaterally GI: Abd soft, non-tender. Pos BS x 4 MS: Extremities nontender, no edema, normal ROM Neurologic: Alert and oriented x 4.  GU: Neg CVAT.   LAB RESULTS  Results for orders placed or performed during the hospital encounter of 02/20/16 (from the past 168 hour(s))  Urinalysis, Routine w reflex microscopic (not at [facility])   Collection Time: 02/20/16  1:06 PM  Result Value Ref Range   Color, Urine YELLOW YELLOW   APPearance CLEAR CLEAR   Specific Gravity, Urine 1.025 1.005 - 1.030   pH 5.5 5.0 - 8.0   Glucose, UA NEGATIVE NEGATIVE mg/dL   Hgb urine dipstick LARGE (A) NEGATIVE   Bilirubin Urine NEGATIVE NEGATIVE   Ketones, ur NEGATIVE NEGATIVE mg/dL   Protein, ur NEGATIVE NEGATIVE mg/dL   Nitrite NEGATIVE NEGATIVE   Leukocytes, UA NEGATIVE NEGATIVE  Urine microscopic-add on   Collection Time: 02/20/16  1:06 PM  Result Value Ref Range   Squamous Epithelial / LPF 0-5 (A) NONE SEEN   WBC, UA 0-5 0 - 5 WBC/hpf   RBC / HPF 0-5 0 - 5 RBC/hpf   Bacteria, UA NONE SEEN NONE SEEN  Protein / creatinine ratio, urine   Collection Time: 02/20/16  1:06 PM  Result Value Ref Range   Creatinine, Urine 230.00 mg/dL   Total Protein, Urine 11 mg/dL   Protein Creatinine Ratio 0.05 0.00 - 0.15 mg/mg[Cre]  Pregnancy, urine POC   Collection Time: 02/20/16  1:13 PM  Result Value Ref Range   Preg Test, Ur NEGATIVE NEGATIVE  CBC   Collection Time: 02/20/16  1:45 PM  Result Value Ref Range   WBC 6.2 4.0 - 10.5 K/uL   RBC 3.44 (L) 3.87 - 5.11 MIL/uL   Hemoglobin 9.6 (L) 12.0 - 15.0 g/dL   HCT 29.7 (L) 36.0 - 46.0 %   MCV 86.3 78.0 - 100.0 fL   MCH 27.9 26.0 - 34.0 pg   MCHC 32.3 30.0 - 36.0 g/dL   RDW 13.9 11.5 - 15.5 %   Platelets 331 150 - 400 K/uL  Comprehensive metabolic panel   Collection Time: 02/20/16  1:45 PM  Result Value Ref Range   Sodium 135 135 - 145 mmol/L    Potassium 3.9 3.5 - 5.1 mmol/L   Chloride 107 101 - 111 mmol/L   CO2 23 22 - 32 mmol/L   Glucose, Bld 83 65 - 99 mg/dL   BUN 13 6 - 20 mg/dL   Creatinine, Ser 0.82 0.44 - 1.00 mg/dL   Calcium 8.7 (L) 8.9 - 10.3 mg/dL   Total Protein 6.5 6.5 - 8.1 g/dL   Albumin 3.3 (L) 3.5 -  5.0 g/dL   AST 18 15 - 41 U/L   ALT 17 14 - 54 U/L   Alkaline Phosphatase 73 38 - 126 U/L   Total Bilirubin 0.3 0.3 - 1.2 mg/dL   GFR calc non Af Amer >60 >60 mL/min   GFR calc Af Amer >60 >60 mL/min   Anion gap 5 5 - 15    --/--/O POS (10/22 0802)  IMAGING   MAU Management/MDM: Ordered labs and reviewed results.  No evidence of preeclampsia today but borderline elevated BP.  Pt given GI Cocktail which significantly improved chest pain.  Pain is likely acid reflux. Recommend Zantac 150 mg BID.  F/U with primary care provider if persists. F/U in office for BP check this week.  Warning signs/reasons to return to ED reviewed.   Pt stable at time of discharge.  ASSESSMENT No diagnosis found. 1. Postpartum hypertension   2. Gastroesophageal reflux disease with esophagitis    PLAN Discharge home     Medication List    TAKE these medications   acetaminophen 500 MG tablet Commonly known as:  TYLENOL Take 1,000 mg by mouth every 6 (six) hours as needed for moderate pain.   calcium carbonate 500 MG chewable tablet Commonly known as:  TUMS - dosed in mg elemental calcium Chew 2 tablets by mouth 3 (three) times daily as needed for indigestion or heartburn.   oxyCODONE-acetaminophen 5-325 MG tablet Commonly known as:  PERCOCET/ROXICET Take 2 tablets by mouth every 4 (four) hours as needed (pain scale > 7).       Fatima Blank Certified Nurse-Midwife 02/20/2016  1:57 PM

## 2016-02-20 NOTE — Discharge Instructions (Signed)
Esophageal Spasm An esophageal spasm is a muscle spasm of the tube that connects the back of your throat to your stomach (esophagus). Your esophagus normally moves food and liquids down into your stomach with smooth, wavelike muscle contractions. If you have esophageal spasms, abnormal muscle contractions in your esophagus can be painful and cause you to have difficulty swallowing (dysphagia). There are two types of esophageal spasms. You may have one or both types: Diffuse esophageal spasms. These are irregular, uncoordinated muscle movements of the esophagus. The diffuse type causes more dysphagia. Nutcracker esophagus. This is a type of muscle contraction that is coordinated but too strong. The muscles move in a regular order, but the contraction is stronger than necessary. The nutcracker type of esophageal spasm is more painful. Severe cases of esophageal spasm can make it hard to eat well and do all your usual activities. Esophageal spasms often occur along with severe heartburn (reflux esophagitis). Swallowed foods or liquids may also come back up into your throat (regurgitation).  CAUSES  The cause of esophageal spasm is not known. RISK FACTORS You may have a higher risk for esophageal spasm if you: Are female. Are an older person. Eat very quickly. Swallow foods or drinks that are very hot or very cold. Are depressed or anxious. SIGNS AND SYMPTOMS  Signs and symptoms can vary from day to day. They may be mild or severe. They may last for minutes or hours. Common signs and symptoms include: Chest pain. This may feel like a heart attack. Back pain. Dysphagia. Heartburn. The feeling that something is stuck in your throat (globus). Regurgitation of foods or liquids. DIAGNOSIS  Your health care provider may suspect esophageal spasm based on your symptoms and a physical exam. Tests may be done to help confirm the diagnosis. These may include: Endoscopy. A flexible telescope is passed into  your esophagus. Barium swallow. An X-ray is done while you drink a substance (contrast material) that shows up on X-ray. Esophageal manometry. Pressure measurements of the inside of your esophagus are taken while you swallow. TREATMENT  Mild cases of esophageal spasms may not need to be treated. You may be able to manage the spasms by avoiding the foods and eating habits that trigger them. Treatment for spasms that are more severe or frequent can include the following: You may be given medicine to: Relax the esophageal muscles. Relieve muscle spasms (calcium channel blockers and nitrates). Block nerve endings in the esophagus. This is done with an injection of a certain toxin (botulinum). Relieve heartburn (proton pump inhibitors). Antidepressant medicines are sometimes used to ease symptoms. For severe cases, surgery is sometimes done to reduce esophageal muscle contractions (myotomy). HOME CARE INSTRUCTIONS  Take medicines only as directed by your health care provider. Do not eat foods that trigger heartburn or esophageal spasms. Eat your meals slowly. Chew your food completely. Avoid foods and drinks that are very hot or very cold. Find ways to manage stress. Ask for help if you struggle with depression or anxiety. Keep all follow-up visits as directed by your health care provider. This is important. SEEK MEDICAL CARE IF:  Your symptoms change or get worse. You are losing weight because of dysphagia. Your medicine is not helping your symptoms. Your esophageal spasms are interfering with your quality of life. SEEK IMMEDIATE MEDICAL CARE IF:  You have very bad or unusual chest pain. You have trouble breathing. You choke. MAKE SURE YOU: Understand these instructions. Will watch your condition. Will get help right away if  you are not doing well or get worse.   This information is not intended to replace advice given to you by your health care provider. Make sure you discuss any  questions you have with your health care provider.   Document Released: 06/19/2002 Document Revised: 04/19/2014 Document Reviewed: 06/22/2013 Elsevier Interactive Patient Education 2016 [place]. Gastroesophageal Reflux Disease, Adult Normally, food travels down the esophagus and stays in the stomach to be digested. However, when a person has gastroesophageal reflux disease (GERD), food and stomach acid move back up into the esophagus. When this happens, the esophagus becomes sore and inflamed. Over time, GERD can create small holes (ulcers) in the lining of the esophagus.  CAUSES This condition is caused by a problem with the muscle between the esophagus and the stomach (lower esophageal sphincter, or LES). Normally, the LES muscle closes after food passes through the esophagus to the stomach. When the LES is weakened or abnormal, it does not close properly, and that allows food and stomach acid to go back up into the esophagus. The LES can be weakened by certain dietary substances, medicines, and medical conditions, including:  Tobacco use.  Pregnancy.  Having a hiatal hernia.  Heavy alcohol use.  Certain foods and beverages, such as coffee, chocolate, onions, and peppermint. RISK FACTORS This condition is more likely to develop in:  People who have an increased body weight.  People who have connective tissue disorders.  People who use NSAID medicines. SYMPTOMS Symptoms of this condition include:  Heartburn.  Difficult or painful swallowing.  The feeling of having a lump in the throat.  Abitter taste in the mouth.  Bad breath.  Having a large amount of saliva.  Having an upset or bloated stomach.  Belching.  Chest pain.  Shortness of breath or wheezing.  Ongoing (chronic) cough or a night-time cough.  Wearing away of tooth enamel.  Weight loss. Different conditions can cause chest pain. Make sure to see your health care provider if you experience chest  pain. DIAGNOSIS Your health care provider will take a medical history and perform a physical exam. To determine if you have mild or severe GERD, your health care provider may also monitor how you respond to treatment. You may also have other tests, including:  An endoscopy toexamine your stomach and esophagus with a small camera.  A test thatmeasures the acidity level in your esophagus.  A test thatmeasures how much pressure is on your esophagus.  A barium swallow or modified barium swallow to show the shape, size, and functioning of your esophagus. TREATMENT The goal of treatment is to help relieve your symptoms and to prevent complications. Treatment for this condition may vary depending on how severe your symptoms are. Your health care provider may recommend:  Changes to your diet.  Medicine.  Surgery. HOME CARE INSTRUCTIONS Diet  Follow a diet as recommended by your health care provider. This may involve avoiding foods and drinks such as:  Coffee and tea (with or without caffeine).  Drinks that containalcohol.  Energy drinks and sports drinks.  Carbonated drinks or sodas.  Chocolate and cocoa.  Peppermint and mint flavorings.  Garlic and onions.  Horseradish.  Spicy and acidic foods, including peppers, chili powder, curry powder, vinegar, hot sauces, and barbecue sauce.  Citrus fruit juices and citrus fruits, such as oranges, lemons, and limes.  Tomato-based foods, such as red sauce, chili, salsa, and pizza with red sauce.  Fried and fatty foods, such as donuts, french fries, potato  chips, and high-fat dressings.  High-fat meats, such as hot dogs and fatty cuts of red and white meats, such as rib eye steak, sausage, ham, and bacon.  High-fat dairy items, such as whole milk, butter, and cream cheese.  Eat small, frequent meals instead of large meals.  Avoid drinking large amounts of liquid with your meals.  Avoid eating meals during the 2-3 hours before  bedtime.  Avoid lying down right after you eat.  Do not exercise right after you eat. General Instructions  Pay attention to any changes in your symptoms.  Take over-the-counter and prescription medicines only as told by your health care provider. Do not take aspirin, ibuprofen, or other NSAIDs unless your health care provider told you to do so.  Do not use any tobacco products, including cigarettes, chewing tobacco, and e-cigarettes. If you need help quitting, ask your health care provider.  Wear loose-fitting clothing. Do not wear anything tight around your waist that causes pressure on your abdomen.  Raise (elevate) the head of your bed 6 inches (15cm).  Try to reduce your stress, such as with yoga or meditation. If you need help reducing stress, ask your health care provider.  If you are overweight, reduce your weight to an amount that is healthy for you. Ask your health care provider for guidance about a safe weight loss goal.  Keep all follow-up visits as told by your health care provider. This is important. SEEK MEDICAL CARE IF:  You have new symptoms.  You have unexplained weight loss.  You have difficulty swallowing, or it hurts to swallow.  You have wheezing or a persistent cough.  Your symptoms do not improve with treatment.  You have a hoarse voice. SEEK IMMEDIATE MEDICAL CARE IF:  You have pain in your arms, neck, jaw, teeth, or back.  You feel sweaty, dizzy, or light-headed.  You have chest pain or shortness of breath.  You vomit and your vomit looks like blood or coffee grounds.  You faint.  Your stool is bloody or black.  You cannot swallow, drink, or eat.   This information is not intended to replace advice given to you by your health care provider. Make sure you discuss any questions you have with your health care provider.   Document Released: 01/06/2005 Document Revised: 12/18/2014 Document Reviewed: 07/24/2014 Elsevier Interactive Patient  Education [employer].

## 2016-02-26 ENCOUNTER — Other Ambulatory Visit: Payer: Self-pay | Admitting: Obstetrics & Gynecology

## 2016-03-08 NOTE — Patient Instructions (Signed)
Your procedure is scheduled on:  Thursday, Dec. 7, 2017  Enter through the [employer] [facility] at:  9:45 AM  Pick up the phone at the desk and dial [phone].  Call this number if you have problems the morning of surgery: [phone].  Remember: Do NOT eat food or drink after:  Midnight Wednesday, Dec. 6, 2017  Take these medicines the morning of surgery with a SIP OF WATER:  None  Stop ALL herbal medications at this time   Do NOT wear jewelry (body piercing), metal hair clips/bobby pins, make-up, or nail polish. Do NOT wear lotions, powders, or perfumes.  You may wear deodorant. Do NOT shave for 48 hours prior to surgery. Do NOT bring valuables to the hospital. Contacts, dentures, or bridgework may not be worn into surgery.  Have a responsible adult drive you home and stay with you for 24 hours after your procedure

## 2016-03-09 ENCOUNTER — Inpatient Hospital Stay (HOSPITAL_COMMUNITY)
Admission: RE | Admit: 2016-03-09 | Discharge: 2016-03-09 | Disposition: A | Discharge status: Home / Self Care | Payer: Medicaid Other | Source: Ambulatory Visit

## 2016-03-18 ENCOUNTER — Encounter (HOSPITAL_COMMUNITY): Admission: RE | Payer: Self-pay | Source: Ambulatory Visit

## 2016-03-18 ENCOUNTER — Ambulatory Visit (HOSPITAL_COMMUNITY)
Admission: RE | Admit: 2016-03-18 | Payer: Medicaid Other | Source: Ambulatory Visit | Admitting: Obstetrics & Gynecology

## 2016-03-18 SURGERY — LIGATION, FALLOPIAN TUBE, LAPAROSCOPIC
Anesthesia: Choice | Laterality: Bilateral

## 2017-05-19 ENCOUNTER — Encounter (HOSPITAL_COMMUNITY): Payer: Self-pay | Admitting: *Deleted

## 2017-05-19 ENCOUNTER — Emergency Department (HOSPITAL_COMMUNITY): Payer: Self-pay

## 2017-05-19 ENCOUNTER — Emergency Department (HOSPITAL_COMMUNITY)
Admission: EM | Admit: 2017-05-19 | Discharge: 2017-05-19 | Disposition: A | Discharge status: Home / Self Care | Payer: Self-pay | Attending: Emergency Medicine | Admitting: Emergency Medicine

## 2017-05-19 ENCOUNTER — Other Ambulatory Visit: Payer: Self-pay

## 2017-05-19 DIAGNOSIS — I10 Essential (primary) hypertension: Secondary | ICD-10-CM | POA: Insufficient documentation

## 2017-05-19 DIAGNOSIS — R51 Headache: Secondary | ICD-10-CM | POA: Insufficient documentation

## 2017-05-19 DIAGNOSIS — H7291 Unspecified perforation of tympanic membrane, right ear: Secondary | ICD-10-CM | POA: Insufficient documentation

## 2017-05-19 DIAGNOSIS — Z87891 Personal history of nicotine dependence: Secondary | ICD-10-CM | POA: Insufficient documentation

## 2017-05-19 DIAGNOSIS — K219 Gastro-esophageal reflux disease without esophagitis: Secondary | ICD-10-CM | POA: Insufficient documentation

## 2017-05-19 DIAGNOSIS — R0789 Other chest pain: Secondary | ICD-10-CM | POA: Insufficient documentation

## 2017-05-19 HISTORY — DX: Essential (primary) hypertension: I10

## 2017-05-19 LAB — CBC WITH DIFFERENTIAL/PLATELET
Basophils Absolute: 0 10*3/uL (ref 0.0–0.1)
Basophils Relative: 0 %
Eosinophils Absolute: 0 10*3/uL (ref 0.0–0.7)
Eosinophils Relative: 1 %
HEMATOCRIT: 37.7 % (ref 36.0–46.0)
Hemoglobin: 12.7 g/dL (ref 12.0–15.0)
LYMPHS PCT: 39 %
Lymphs Abs: 2.6 10*3/uL (ref 0.7–4.0)
MCH: 29.3 pg (ref 26.0–34.0)
MCHC: 33.7 g/dL (ref 30.0–36.0)
MCV: 87.1 fL (ref 78.0–100.0)
MONO ABS: 0.3 10*3/uL (ref 0.1–1.0)
MONOS PCT: 5 %
NEUTROS ABS: 3.7 10*3/uL (ref 1.7–7.7)
Neutrophils Relative %: 55 %
Platelets: 250 10*3/uL (ref 150–400)
RBC: 4.33 MIL/uL (ref 3.87–5.11)
RDW: 13.9 % (ref 11.5–15.5)
WBC: 6.6 10*3/uL (ref 4.0–10.5)

## 2017-05-19 LAB — COMPREHENSIVE METABOLIC PANEL
ALT: 15 U/L (ref 14–54)
ANION GAP: 9 (ref 5–15)
AST: 19 U/L (ref 15–41)
Albumin: 3.8 g/dL (ref 3.5–5.0)
Alkaline Phosphatase: 62 U/L (ref 38–126)
BILIRUBIN TOTAL: 0.6 mg/dL (ref 0.3–1.2)
BUN: 6 mg/dL (ref 6–20)
CO2: 21 mmol/L — ABNORMAL LOW (ref 22–32)
Calcium: 9.3 mg/dL (ref 8.9–10.3)
Chloride: 107 mmol/L (ref 101–111)
Creatinine, Ser: 0.76 mg/dL (ref 0.44–1.00)
GFR calc Af Amer: >60 mL/min (ref >60)
Glucose, Bld: 82 mg/dL (ref 65–99)
Potassium: 3.5 mmol/L (ref 3.5–5.1)
Sodium: 137 mmol/L (ref 135–145)
TOTAL PROTEIN: 7.1 g/dL (ref 6.5–8.1)

## 2017-05-19 LAB — I-STAT TROPONIN, ED: Troponin i, poc: 0 ng/mL (ref 0.00–0.08)

## 2017-05-19 LAB — LIPASE, BLOOD: LIPASE: 24 U/L (ref 11–51)

## 2017-05-19 LAB — CK: Total CK: 227 U/L (ref 38–234)

## 2017-05-19 LAB — I-STAT BETA HCG BLOOD, ED (MC, WL, AP ONLY)

## 2017-05-19 LAB — D-DIMER, QUANTITATIVE: D-Dimer, Quant: 0.45 ug/mL-FEU (ref 0.00–0.50)

## 2017-05-19 MED ORDER — OFLOXACIN 0.3 % OT SOLN: 5.0000 [drp] | Freq: Two times a day (BID) | OTIC | 0 refills | Status: AC

## 2017-05-19 MED ORDER — MECLIZINE HCL 25 MG PO TABS
25.0000 mg | ORAL_TABLET | Freq: Once | ORAL | Status: AC
  Administered 2017-05-19: 25 mg via ORAL
  Filled 2017-05-19: qty 1

## 2017-05-19 MED ORDER — MECLIZINE HCL 25 MG PO TABS: 25.0000 mg | ORAL_TABLET | Freq: Three times a day (TID) | ORAL | 0 refills | Status: DC | PRN

## 2017-05-19 MED ORDER — GI COCKTAIL ~~LOC~~
30.0000 mL | Freq: Once | ORAL | Status: AC
  Administered 2017-05-19: 30 mL via ORAL
  Filled 2017-05-19: qty 30

## 2017-05-19 NOTE — Discharge Instructions (Signed)
Place 5 drops of ofloxacin drops in the right ear 2 times per day for the next 5 days.  Take 1 tablet every 8 hours as needed for dizziness.  Take 600 mg of ibuprofen with food every 8 hours or 1000 mg of Tylenol every 8 hours as needed for body aches or pain.  If your symptoms do not improve within the next 4-5 days, please call Dr. Janace Hoard office with ear nose and throat to schedule a follow-up appointment.  If you develop new or worsening symptoms, including the worst headache of your life, high fever, if your vomiting becomes persistent, if you develop visual changes, or if the chest pain and shortness of breath become constant or change, please return to the emergency department for re-evaluation.

## 2017-05-19 NOTE — ED Provider Notes (Signed)
[facility] EMERGENCY DEPARTMENT Provider Note   CSN: [id number] Arrival date & time: 05/19/17  0805     History   Chief Complaint Chief Complaint  Patient presents with  . Generalized Body Aches  . Headache    HPI Jill Bright is a 42 y.o. female with a history of HTN who presents to the emergency department with multiple complaints.  She reports that she has felt generally unwell over the last 5 days.  She recently began a new exercise regimen and stopped drinking caffeine and has not been hydrating properly.    4 days ago, she reports worsening myalgias with a bilateral frontal headache that she continued to attribute to her recent exercise regimen.    3 days ago, she reports that she had 2 episodes of NBNB emesis with associated dizziness and generalized weakness.    Yesterday she had multiple episodes of emesis that occurred almost hourly with associated nausea, continued generalized weakness, chest pain, dyspnea, subjective fever, a hoarse voice, chills, and diaphoresis..  She reports that she took 400 mg of motrin for her body aches.    Around 3 AM, she reports that her headache significantly worsened so she took 400 mg of Motrin at 3:20 and 3:45 AM with minimal improvement. Last dose 6:15 AM.  She reports that between 7-8 AM that she was sweating so profusely that she had to change her shirt twice.  She reports that she came to the Emergency department because she felt so dizzy that if she bent or turned she felt like she was going to fall over.   She reports right otalgia with green discharge from the right ear over the last week with associated "whizzing" tinnitus decreased hearing.  No left ear complaints.  She characterizes the chest pain as "tight and heavy", constant since onset, and worse when she lays flat.  No alleviating factors.  No history of similar.  She reports that the pain is located from her neck to her mid-sternum and does not radiate.    She denies palpitations, wheezing, cough, lower extremity swelling, diarrhea, abdominal pain, back pain, neck pain or stiffness, rash, or visual changes.  The history is provided by the patient. No language interpreter was used.    Past Medical History:  Diagnosis Date  . Hypertension   . Vaginal Pap smear, abnormal    LEEP, ok since    Patient Active Problem List   Diagnosis Date Noted  . Delivered by cesarean section 01/31/2016  . Preterm premature rupture of membranes in third trimester 01/26/2016  . Normal IUP (intrauterine pregnancy) on prenatal ultrasound 01/30/2012  . Corpus luteum cyst 01/30/2012    Past Surgical History:  Procedure Laterality Date  . CESAREAN SECTION N/A 01/31/2016   Procedure: CESAREAN SECTION;  Surgeon: Sanjuana Kava, MD;  Location: [place];  Service: Obstetrics;  Laterality: N/A;  . CHOLECYSTECTOMY    . DILATION AND CURETTAGE OF UTERUS    . THERAPEUTIC ABORTION      OB History    Gravida Para Term Preterm AB Living   9 4 3 1 5 4    SAB TAB Ectopic Multiple Live Births   2 3   0 4       Home Medications    Prior to Admission medications   Medication Sig Start Date End Date Taking? Authorizing Provider  acetaminophen (TYLENOL) 500 MG tablet Take 1,000 mg by mouth every 6 (six) hours as needed for moderate pain.  [provider]  calcium carbonate (TUMS - DOSED IN MG ELEMENTAL CALCIUM) 500 MG chewable tablet Chew 2 tablets by mouth 3 (three) times daily as needed for indigestion or heartburn.    [provider]  meclizine (ANTIVERT) 25 MG tablet Take 1 tablet (25 mg total) by mouth 3 (three) times daily as needed for dizziness. 05/19/17   Tacari Repass A, PA-C  ofloxacin (FLOXIN) 0.3 % OTIC solution Place 5 drops into the right ear 2 (two) times daily for 5 days. 05/19/17 05/24/17  Charita Lindenberger A, PA-C  oxyCODONE-acetaminophen (PERCOCET/ROXICET) 5-325 MG tablet Take 2 tablets by mouth every 4 (four) hours as needed  (pain scale > 7). Patient not taking: Reported on 02/20/2016 02/03/16   Allyn Kenner, DO    Family History Family History  Problem Relation Age of Onset  . Diabetes Mother   . Hypertension Mother     Social History Social History   Tobacco Use  . Smoking status: Former Smoker    Packs/day: 0.25    Years: 4.00    Pack years: 1.00    Types: Cigarettes  . Smokeless tobacco: Never Used  . Tobacco comment: cutting back 2/day for the past year  Substance Use Topics  . Alcohol use: No  . Drug use: No    Comment: prior to +preg     Allergies   Tea   Review of Systems Review of Systems  Constitutional: Positive for chills, diaphoresis and fever. Negative for activity change.  HENT: Positive for ear discharge, ear pain and hearing loss. Negative for facial swelling, postnasal drip, rhinorrhea and sneezing.   Eyes: Negative for pain and visual disturbance.  Respiratory: Positive for shortness of breath. Negative for cough and chest tightness.   Cardiovascular: Positive for chest pain. Negative for palpitations and leg swelling.  Gastrointestinal: Positive for nausea and vomiting. Negative for abdominal pain and diarrhea.  Endocrine: Negative for polyuria.  Genitourinary: Negative for dysuria, vaginal discharge and vaginal pain.  Musculoskeletal: Positive for myalgias. Negative for back pain, neck pain and neck stiffness.  Skin: Negative for color change and rash.  Allergic/Immunologic: Negative for immunocompromised state.  Neurological: Positive for dizziness, weakness (generalized) and headaches. Negative for syncope and light-headedness.  Hematological: Does not bruise/bleed easily.  Psychiatric/Behavioral: Negative for confusion.     Physical Exam Updated Vital Signs BP (!) 138/94   Pulse 64   Temp 98.4 F (36.9 C) (Oral)   Resp 16   Ht 5\' 6"  (1.676 m)   Wt 89.8 kg (198 lb)   LMP 05/01/2017 (Exact Date)   SpO2 100%   BMI 31.96 kg/m   Physical Exam    Constitutional: She is oriented to person, place, and time. No distress.  HENT:  Head: Normocephalic.  Right Ear: There is mastoid tenderness.  Left Ear: Hearing, tympanic membrane and ear canal normal. There is mastoid tenderness.  Nose: Right sinus exhibits frontal sinus tenderness. Right sinus exhibits no maxillary sinus tenderness. Left sinus exhibits frontal sinus tenderness. Left sinus exhibits no maxillary sinus tenderness.  Mouth/Throat: Uvula is midline, oropharynx is clear and moist and mucous membranes are normal.  Right TM appears perforated.  There is a minimal amount of yellow drainage in the canal.  Eyes: Conjunctivae are normal. Pupils are equal, round, and reactive to light. Right eye exhibits no discharge. Left eye exhibits no discharge. No scleral icterus. Right eye exhibits nystagmus. Left eye exhibits nystagmus.  Neck: Normal range of motion. Neck supple.  No meningeal signs.  Cardiovascular: Normal rate and regular rhythm. Exam reveals no gallop and no friction rub.  No murmur heard. Pulmonary/Chest: Effort normal and breath sounds normal. No stridor. No respiratory distress. She has no wheezes. She has no rales. She exhibits no tenderness.  Abdominal: Soft. Bowel sounds are normal. She exhibits no distension and no mass. There is no tenderness. There is no rebound and no guarding. No hernia.  Musculoskeletal: Normal range of motion. She exhibits no edema, tenderness or deformity.  Lymphadenopathy:    She has no cervical adenopathy.  Neurological: She is alert and oriented to person, place, and time. She has normal strength. She displays a negative Romberg sign. Coordination and gait normal. GCS eye subscore is 4. GCS verbal subscore is 5. GCS motor subscore is 6.  Cranial nerves 2-12 grossly intact. Finger-to-nose is normal. 5/5 motor strength of the bilateral upper and lower extremities. Moves all four extremities. Negative Romberg.  Symmetric tandem gait. NVI.  No  ataxia.   Skin: Skin is warm. Capillary refill takes less than 2 seconds. No rash noted.  Psychiatric: Her behavior is normal.  Nursing note and vitals reviewed.    ED Treatments / Results  Labs (all labs ordered are listed, but only abnormal results are displayed) Labs Reviewed  COMPREHENSIVE METABOLIC PANEL - Abnormal; Notable for the following components:      Result Value   CO2 21 (*)    All other components within normal limits  CBC WITH DIFFERENTIAL/PLATELET  LIPASE, BLOOD  CK  D-DIMER, QUANTITATIVE (NOT AT [facility])  I-STAT TROPONIN, ED  I-STAT BETA HCG BLOOD, ED ([facility], [facility], [facility] ONLY)    EKG  EKG Interpretation None       Radiology Dg Chest 2 View  Result Date: 05/19/2017 CLINICAL DATA:  Dyspnea, chest pain. EXAM: CHEST  2 VIEW COMPARISON:  None. FINDINGS: The heart size and mediastinal contours are within normal limits. Both lungs are clear. No pneumothorax or pleural effusion is noted. The visualized skeletal structures are unremarkable. IMPRESSION: No active cardiopulmonary disease. Electronically Signed   By: Marijo Conception, M.D.   On: 05/19/2017 15:55    Procedures Procedures (including critical care time)  Medications Ordered in ED Medications  meclizine (ANTIVERT) tablet 25 mg (25 mg Oral Given 05/19/17 1611)  gi cocktail (Maalox,Lidocaine,Donnatal) (30 mLs Oral Given 05/19/17 1632)     Initial Impression / Assessment and Plan / ED Course  I have reviewed the triage vital signs and the nursing notes.  Pertinent labs & imaging results that were available during my care of the patient were reviewed by me and considered in my medical decision making (see chart for details).     42 year old female presenting with dizziness, disequilibrium, right otalgia and green discharge from the ear, diaphoresis, chills, chest pain and shortness of breath, myalgias, nausea, and emesis.  Given chest pain, dyspnea, diaphoresis, chest x-ray is negative.  Troponin is negative.   D-dimer is negative.  CBC and electrolytes are reassuring.  EKG with no acute changes.  Pain has resolved after GI cocktail.  Suspect GERD and recommended over-the-counter H2 blockers.  Doubt PE, MI, pericarditis, myocarditis, cardiac tamponade, pneumonia  She also has dizziness, disequilibrium, right otalgia with green discharge from the right ear.  On physical exam, the right TM is perforated and yellow drainage is noted in the canal.  She also has nystagmus on exam.  Meclizine given in the ED with resolution of her disequilibrium and dizziness.  Will discharge the patient with a course  of meclizine and ofloxacin for middle ear trauma.  Head CT is not indicated at this time as she had no focal neurological deficits.  Doubt mastoiditis or meningitis.  She also recently started a new exercise program and was complaining of myalgias. CK level is not elevated.  Doubt influenza or upper respiratory infection.  We will plan to discharge home with ofloxacin and meclizine.  The plan was discussed with the patient who is agreeable at this time and all cushions were answered.  She is hemodynamically stable and in no acute distress.  The patient is safe for discharge at this time.    Final Clinical Impressions(s) / ED Diagnoses   Final diagnoses:  Perforation of right tympanic membrane  Gastroesophageal reflux disease, esophagitis presence not specified    ED Discharge Orders        Ordered    ofloxacin (FLOXIN) 0.3 % OTIC solution  2 times daily     05/19/17 1636    meclizine (ANTIVERT) 25 MG tablet  3 times daily PRN     05/19/17 1636       Blakely Gluth A, PA-C 05/19/17 1657    Sherwood Gambler, MD 05/20/17 2211

## 2017-05-19 NOTE — ED Triage Notes (Signed)
Pt states son came home sick and mother was recently diagnosed with flu.  Pt states she started having body aches on Tuesday and then headache (throbbing) with vomiting yesterday.  Today weak and has had sweats.  Motrin taken at 0630.

## 2017-06-25 ENCOUNTER — Emergency Department (HOSPITAL_COMMUNITY)
Admission: EM | Admit: 2017-06-25 | Discharge: 2017-06-25 | Disposition: A | Discharge status: Home / Self Care | Payer: Self-pay | Attending: Emergency Medicine | Admitting: Emergency Medicine

## 2017-06-25 ENCOUNTER — Encounter (HOSPITAL_COMMUNITY): Payer: Self-pay

## 2017-06-25 ENCOUNTER — Emergency Department (HOSPITAL_COMMUNITY): Payer: Self-pay

## 2017-06-25 DIAGNOSIS — Z79899 Other long term (current) drug therapy: Secondary | ICD-10-CM | POA: Insufficient documentation

## 2017-06-25 DIAGNOSIS — Z87891 Personal history of nicotine dependence: Secondary | ICD-10-CM | POA: Insufficient documentation

## 2017-06-25 DIAGNOSIS — M25511 Pain in right shoulder: Secondary | ICD-10-CM | POA: Insufficient documentation

## 2017-06-25 DIAGNOSIS — I10 Essential (primary) hypertension: Secondary | ICD-10-CM | POA: Insufficient documentation

## 2017-06-25 MED ORDER — NAPROXEN 500 MG PO TABS: 500.0000 mg | ORAL_TABLET | Freq: Two times a day (BID) | ORAL | 0 refills | Status: AC

## 2017-06-25 MED ORDER — IBUPROFEN 400 MG PO TABS
600.0000 mg | ORAL_TABLET | Freq: Once | ORAL | Status: AC
  Administered 2017-06-25: 600 mg via ORAL
  Filled 2017-06-25: qty 1

## 2017-06-25 MED ORDER — METHOCARBAMOL 500 MG PO TABS: 500.0000 mg | ORAL_TABLET | Freq: Two times a day (BID) | ORAL | 0 refills | Status: DC

## 2017-06-25 NOTE — ED Notes (Addendum)
This EMT called patient for room twice. RN called for patient once. Patient was last known to be in xray. Xray tech was to place patient in room A6 after completion of xray. Patient was instead placed in A5, which was not a room currently being used at the time. Pod A staff was not notified of patient being placed in room. RN tried locating patient in xray when patient could not be found in lobby, patient was not in xray any longer. Patient was found when triage nurse intended to take a different patient to the bathroom in A5. EDP immediately notified and tended to patient for further care.

## 2017-06-25 NOTE — ED Provider Notes (Signed)
[facility] EMERGENCY DEPARTMENT Provider Note   CSN: [id number] Arrival date & time: 06/25/17  1843     History   Chief Complaint Chief Complaint  Patient presents with  . Shoulder Pain    HPI Jill Bright is a 42 y.o. female history of hypertension who presents the emergency department today for right shoulder pain.  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  After a day of work she notices a throbbing sensation in her right shoulder that is typically worse with overhead movements as well as activities that require engagement of the biceps.  The patient has been taking Tylenol 1000 mg every 4 hours at home without relief.  She denies any associated numbness/tingling/weakness of the extremity, trauma, joint swelling, overlying erythema, rash, chest pain, shortness of breath, nausea, diaphoresis associated with this.  She has never had problems with this before.  HPI  Past Medical History:  Diagnosis Date  . Hypertension   . Vaginal Pap smear, abnormal    LEEP, ok since    Patient Active Problem List   Diagnosis Date Noted  . Delivered by cesarean section 01/31/2016  . Preterm premature rupture of membranes in third trimester 01/26/2016  . Normal IUP (intrauterine pregnancy) on prenatal ultrasound 01/30/2012  . Corpus luteum cyst 01/30/2012    Past Surgical History:  Procedure Laterality Date  . CESAREAN SECTION N/A 01/31/2016   Procedure: CESAREAN SECTION;  Surgeon: Sanjuana Kava, MD;  Location: [facility];  Service: Obstetrics;  Laterality: N/A;  . CHOLECYSTECTOMY    . DILATION AND CURETTAGE OF UTERUS    . THERAPEUTIC ABORTION      OB History    Gravida Para Term Preterm AB Living   9 4 3 1 5 4    SAB TAB Ectopic Multiple Live Births   2 3   0 4       Home Medications    Prior to Admission medications   Medication Sig Start Date End Date Taking? Authorizing Provider  acetaminophen (TYLENOL) 500 MG  tablet Take 1,000 mg by mouth every 6 (six) hours as needed for moderate pain.     [provider]  calcium carbonate (TUMS - DOSED IN MG ELEMENTAL CALCIUM) 500 MG chewable tablet Chew 2 tablets by mouth 3 (three) times daily as needed for indigestion or heartburn.    [provider]  meclizine (ANTIVERT) 25 MG tablet Take 1 tablet (25 mg total) by mouth 3 (three) times daily as needed for dizziness. 05/19/17   McDonald, Mia A, PA-C  oxyCODONE-acetaminophen (PERCOCET/ROXICET) 5-325 MG tablet Take 2 tablets by mouth every 4 (four) hours as needed (pain scale > 7). Patient not taking: Reported on 02/20/2016 02/03/16   Allyn Kenner, DO    Family History Family History  Problem Relation Age of Onset  . Diabetes Mother   . Hypertension Mother     Social History Social History   Tobacco Use  . Smoking status: Former Smoker    Packs/day: 0.25    Years: 4.00    Pack years: 1.00    Types: Cigarettes  . Smokeless tobacco: Never Used  . Tobacco comment: cutting back 2/day for the past year  Substance Use Topics  . Alcohol use: No  . Drug use: No    Comment: prior to +preg     Allergies   Tea   Review of Systems Review of Systems  All other systems reviewed and are negative.  Physical Exam Updated Vital Signs BP (!) 178/105 (BP Location: Left Arm)   Pulse 74   Temp 97.8 F (36.6 C) (Oral)   Resp 17   SpO2 100%   Physical Exam  Constitutional: She appears well-developed and well-nourished.  HENT:  Head: Normocephalic and atraumatic.  Right Ear: External ear normal.  Left Ear: External ear normal.  Eyes: Conjunctivae are normal. Right eye exhibits no discharge. Left eye exhibits no discharge. No scleral icterus.  Cardiovascular:  Pulses:      Radial pulses are 2+ on the right side.  Pulmonary/Chest: Effort normal. No respiratory distress.  Musculoskeletal:  Cervical Spine: Appearance normal. No obvious bony deformity. No skin swelling, erythema,  heat, fluctuance or break of the skin. No TTP over the cervical spinous processes. No paraspinal tenderness. No step-offs. Patient is able to actively rotate their neck 45 degrees left and right voluntarily without pain and flex and extend the neck without pain. Negative Spurling's. Right Shoulder: Appearance normal. No obvious bony deformity. No skin swelling, erythema, heat, fluctuance or break of the skin. No clavicular deformity or TTP. TTP over proximal bicep at the area of the bicipital groove.  Patient notes pain with flexion of the shoulder to 90 degrees.  Passive flexion to 160 degrees noted to be painful.  Intact and active extension, abduction, adduction, internal/external rotation without pain or crepitus.  Strength intact for all movements.  Mildly decreased strength for flexion secondary to pain.  Patient has positive speeds test.  Equivocal Neer's and Hawkins test. Right Elbow: Appearance normal. No obvious bony deformity. No skin swelling, erythema, heat, fluctuance or break of the skin. No TTP over joint. Active flexion, extension, supination and pronation full and intact without pain. Strength able and appropriate for age for flexion and extension.  Radial Pulse 2+. Cap refill <2 seconds. SILT for M/U/R distributions. Compartments soft.   Neurological: She is alert. She has normal strength. No sensory deficit.  Skin: Skin is warm, dry and intact. Capillary refill takes less than 2 seconds. No rash noted. Rash is not vesicular. No erythema. No pallor.  There is no vesicular-like rash noted on the right shoulder.  There is no overlying erythema, heat, induration.  Psychiatric: She has a normal mood and affect.  Nursing note and vitals reviewed.    ED Treatments / Results  Labs (all labs ordered are listed, but only abnormal results are displayed) Labs Reviewed - No data to display  EKG  EKG Interpretation None       Radiology Dg Shoulder Right  Result Date:  06/25/2017 CLINICAL DATA:  Progressive pain EXAM: RIGHT SHOULDER - 2+ VIEW COMPARISON:  None. FINDINGS: Oblique, Y scapular, and axillary images obtained. No fracture or dislocation. Joint spaces appear normal. No erosive change. Visualized right lung clear. IMPRESSION: No fracture or dislocation.  No evident arthropathy. Electronically Signed   By: Lowella Grip III M.D.   On: 06/25/2017 19:47    Procedures Procedures (including critical care time)  Medications Ordered in ED Medications  ibuprofen (ADVIL,MOTRIN) tablet 600 mg (not administered)     Initial Impression / Assessment and Plan / ED Course  I have reviewed the triage vital signs and the nursing notes.  Pertinent labs & imaging results that were available during my care of the patient were reviewed by me and considered in my medical decision making (see chart for details).     42 year old female presenting with right shoulder pain since Thursday.  Patient history and exam is consistent  with proximal biceps tendinitis.  She is tender palpation over the bicipital groove with a positive Speed test and does repetitive motions throughout the day that require recruitment of the biceps muscle.  There is no concern at this time for septic joint.  No history of trauma and patient's x-ray without evidence of fracture or dislocation.  Patient placed in shoulder sling and will be given referral to orthopedics.  Recommend conservative therapy.  Will treat with NSAIDs as the patient does not have a history of CKD or GI bleed.  Patient is to take shoulder out of the shoulder sling at least 1 time per day and perform shoulder range of motion exercises in order to prevent frozen shoulder. Specific return precautions discussed. Time was given for all questions to be answered. The patient verbalized understanding and agreement with plan. The patient appears safe for discharge home.  Final Clinical Impressions(s) / ED Diagnoses   Final diagnoses:   Acute pain of right shoulder    ED Discharge Orders        Ordered    naproxen (NAPROSYN) 500 MG tablet  2 times daily     06/25/17 2252       Lorelle Gibbs 06/25/17 2254    Tegeler, Gwenyth Allegra, MD 06/25/17 2350

## 2017-06-25 NOTE — ED Notes (Signed)
Pt in X ray

## 2017-06-25 NOTE — ED Notes (Signed)
Patient transported to X-ray 

## 2017-06-25 NOTE — ED Notes (Signed)
Called patient x3 for room. No answer. Patient not in xray. RN notified.

## 2017-06-25 NOTE — ED Triage Notes (Signed)
Patient complains of right shoulder pain since Thursday. Denies trauma but reports the pain is worse with any ROM.

## 2017-06-25 NOTE — Discharge Instructions (Signed)
The proximal biceps tendon is a strong cord of tissue that connects the biceps muscle, on the front of the upper arm, to the shoulder blade. Tendinitis is inflammation of a tendon.  I placed her in a shoulder sling to allow for rest.    Please go to the section called shoulder range of motion exercises and perform this at least 1 time per day in order to prevent frozen shoulder. The sling should only act as a reminder to let your rest your arm over the next few days.   I also attached a handout on biceps tendon rehab exercises.  I would like you to follow along and perform these in order to lengthen his area and increase range of motion during your healing process.  Please take naproxen as prescribed.  Please follow-up with orthopedist for further evaluation. If you develop worsening or new concerning symptoms you can return to the emergency department for re-evaluation.   Your blood pressure was high during todays visit. Please follow up with your Primary care in regards to this.

## 2022-01-18 NOTE — Progress Notes (Deleted)
   CC: new patient visit  HPI:  [name] is a 46 y.o. female with past medical history of HTN that presents for a new patient visit.    Past medical history: Denies: HTN, DM, HF, MI, DVT, stroke, cancer  Family history: Denies: HTN, DM, HF, MI, DVT, stroke, cancer  Past surgical history:  Past social history:  Medications:    Allergies as of 01/18/2022       Reactions   Jill Bright Hives        Medication List        Accurate as of January 18, 2022  7:46 AM. If you have any questions, ask your nurse or doctor.          acetaminophen 500 MG tablet Commonly known as: TYLENOL Take 1,000 mg by mouth every 6 (six) hours as needed for moderate pain.   calcium carbonate 500 MG chewable tablet Commonly known as: TUMS - dosed in mg elemental calcium Chew 2 tablets by mouth 3 (three) times daily as needed for indigestion or heartburn.   meclizine 25 MG tablet Commonly known as: ANTIVERT Take 1 tablet (25 mg total) by mouth 3 (three) times daily as needed for dizziness.   methocarbamol 500 MG tablet Commonly known as: ROBAXIN Take 1 tablet (500 mg total) by mouth 2 (two) times daily.   naproxen 500 MG tablet Commonly known as: NAPROSYN Take 1 tablet (500 mg total) by mouth 2 (two) times daily.   oxyCODONE-acetaminophen 5-325 MG tablet Commonly known as: PERCOCET/ROXICET Take 2 tablets by mouth every 4 (four) hours as needed (pain scale > 7).         Past Medical History:  Diagnosis Date   Hypertension    Vaginal Pap smear, abnormal    LEEP, ok since   Review of Systems:  per HPI.   Physical Exam: *** There were no vitals filed for this visit.  *** Constitutional: Well-developed, well-nourished, appears comfortable  HENT: Normocephalic and atraumatic.  Eyes: EOM are normal. PERRL.  Neck: Normal range of motion.  Cardiovascular: Regular rate, regular rhythm. No murmurs, rubs, or gallops. Normal radial and PT pulses bilaterally. No LE edema.   Pulmonary: Normal respiratory effort. No wheezes, rales, or rhonchi.   Abdominal: Soft. Non-distended. No tenderness. Normal bowel sounds.  Musculoskeletal: Normal range of motion.     Neurological: Alert and oriented to person, place, and time. Non-focal. Skin: warm and dry.    Assessment & Plan:   ?: ***  1. - Current medications include  Plan: -   2. - Current medications include  Plan: -  3. - Current medications include  Plan: -  4. Health Screening: -  - Medication refill?  Plan: -   See Encounters Tab for problem based charting.  Patient seen with Dr. Barbaraann Boys

## 2022-08-15 DIAGNOSIS — M25531 Pain in right wrist: Secondary | ICD-10-CM | POA: Diagnosis not present

## 2022-08-17 ENCOUNTER — Ambulatory Visit: Payer: Medicaid Other | Admitting: Student

## 2022-09-13 ENCOUNTER — Ambulatory Visit (INDEPENDENT_AMBULATORY_CARE_PROVIDER_SITE_OTHER): Payer: Medicaid Other

## 2022-09-13 ENCOUNTER — Encounter (HOSPITAL_COMMUNITY): Payer: Self-pay | Admitting: Emergency Medicine

## 2022-09-13 ENCOUNTER — Ambulatory Visit (HOSPITAL_COMMUNITY)
Admission: EM | Admit: 2022-09-13 | Discharge: 2022-09-13 | Disposition: A | Discharge status: Home / Self Care | Payer: Medicaid Other

## 2022-09-13 DIAGNOSIS — M19031 Primary osteoarthritis, right wrist: Secondary | ICD-10-CM

## 2022-09-13 DIAGNOSIS — I10 Essential (primary) hypertension: Secondary | ICD-10-CM

## 2022-09-13 DIAGNOSIS — M25531 Pain in right wrist: Secondary | ICD-10-CM | POA: Diagnosis not present

## 2022-09-13 MED ORDER — PREDNISONE 20 MG PO TABS: 40.0000 mg | ORAL_TABLET | Freq: Every day | ORAL | 0 refills | Status: AC

## 2022-09-13 MED ORDER — PREDNISONE 20 MG PO TABS: 20.0000 mg | ORAL_TABLET | Freq: Once | ORAL | Status: DC

## 2022-09-13 MED ORDER — AMLODIPINE BESYLATE 5 MG PO TABS: 5.0000 mg | ORAL_TABLET | Freq: Every day | ORAL | 2 refills | Status: DC

## 2022-09-13 NOTE — ED Provider Notes (Signed)
[facility]    CSN: [id number] Arrival date & time: 09/13/22  1940      History   Chief Complaint Chief Complaint  Patient presents with   Wrist Pain    HPI Jill Bright is a 47 y.o. female.   Patient presents to urgent care for evaluation of pain to the right wrist that has been persistent and gradually worsening over the last 5 days. No recent trauma/injuries to the right wrist to report. Pain comes and goes and is currently a 9 on a scale of 0-10. Pain is worst in the morning and improves slightly throughout the day. States her mom says "she was born with arthritis" and she has arthritis to many joints in her body. No numbness/tingling distally to pain, overlying rash to the skin, or radicular pain. She has been using ibuprofen, tylenol, and other OTC medications without relief.   BP noticeably high today at 187/103. Blood pressures have been elevated at previous visits as well. She does not currently take any antihypertensive medications but she does have a history of hypertension. No headaches, dizziness, chest pain, shortness of breath, arm weakness, paresthesias, or heart palpitations reported. She is a former smoker, denies other drug use. She does not currently have a PCP.    Wrist Pain    Past Medical History:  Diagnosis Date   Hypertension    Vaginal Pap smear, abnormal    LEEP, ok since    Patient Active Problem List   Diagnosis Date Noted   Essential hypertension 09/17/2022   Delivered by cesarean section 01/31/2016   Preterm premature rupture of membranes in third trimester 01/26/2016   Normal IUP (intrauterine pregnancy) on prenatal ultrasound 01/30/2012   Corpus luteum cyst 01/30/2012    Past Surgical History:  Procedure Laterality Date   CESAREAN SECTION N/A 01/31/2016   Procedure: CESAREAN SECTION;  Surgeon: Essie Hart, MD;  Location: [facility];  Service: Obstetrics;  Laterality: N/A;   CHOLECYSTECTOMY     DILATION AND  CURETTAGE OF UTERUS     THERAPEUTIC ABORTION      OB History     Gravida  9   Para  4   Term  3   Preterm  1   AB  5   Living  4      SAB  2   IAB  3   Ectopic      Multiple  0   Live Births  4            Home Medications    Prior to Admission medications   Medication Sig Start Date End Date Taking? Authorizing Provider  amLODipine (NORVASC) 5 MG tablet Take 1 tablet (5 mg total) by mouth daily. 09/13/22  Yes Carlisle Beers, FNP  predniSONE (DELTASONE) 20 MG tablet Take 2 tablets (40 mg total) by mouth daily for 5 days. 09/13/22 09/18/22 Yes Carlisle Beers, FNP  acetaminophen (TYLENOL) 500 MG tablet Take 1,000 mg by mouth every 6 (six) hours as needed for moderate pain.     [provider]  calcium carbonate (TUMS - DOSED IN MG ELEMENTAL CALCIUM) 500 MG chewable tablet Chew 2 tablets by mouth 3 (three) times daily as needed for indigestion or heartburn.    [provider]  meclizine (ANTIVERT) 25 MG tablet Take 1 tablet (25 mg total) by mouth 3 (three) times daily as needed for dizziness. 05/19/17   McDonald, Mia A, PA-C  methocarbamol (ROBAXIN) 500 MG tablet Take  1 tablet (500 mg total) by mouth 2 (two) times daily. 06/25/17   Maczis, Elmer Sow, PA-C  naproxen (NAPROSYN) 500 MG tablet Take 1 tablet (500 mg total) by mouth 2 (two) times daily. 06/25/17   Maczis, Elmer Sow, PA-C  oxyCODONE-acetaminophen (PERCOCET/ROXICET) 5-325 MG tablet Take 2 tablets by mouth every 4 (four) hours as needed (pain scale > 7). Patient not taking: Reported on 02/20/2016 02/03/16   Philip Aspen, DO    Family History Family History  Problem Relation Age of Onset   Diabetes Mother    Hypertension Mother     Social History Social History   Tobacco Use   Smoking status: Former    Packs/day: 0.25    Years: 4.00    Additional pack years: 0.00    Total pack years: 1.00    Types: Cigarettes   Smokeless tobacco: Never   Tobacco comments:    cutting  back 2/day for the past year  Substance Use Topics   Alcohol use: No   Drug use: No    Types: Marijuana    Comment: prior to +preg     Allergies   Tea   Review of Systems Review of Systems Per HPI  Physical Exam Triage Vital Signs ED Triage Vitals  Enc Vitals Group     BP 09/13/22 2059 (!) 187/103     Pulse Rate 09/13/22 2059 60     Resp 09/13/22 2059 17     Temp 09/13/22 2059 98.3 F (36.8 C)     Temp Source 09/13/22 2059 Oral     SpO2 09/13/22 2059 98 %     Weight --      Height --      Head Circumference --      Peak Flow --      Pain Score 09/13/22 2058 9     Pain Loc --      Pain Edu? --      Excl. in GC? --    No data found.  Updated Vital Signs BP (!) 187/103 (BP Location: Left Arm)   Pulse 60   Temp 98.3 F (36.8 C) (Oral)   Resp 17   LMP 09/06/2022   SpO2 98%   Visual Acuity Right Eye Distance:   Left Eye Distance:   Bilateral Distance:    Right Eye Near:   Left Eye Near:    Bilateral Near:     Physical Exam Vitals and nursing note reviewed.  Constitutional:      Appearance: She is not ill-appearing or toxic-appearing.  HENT:     Head: Normocephalic and atraumatic.     Right Ear: Hearing and external ear normal.     Left Ear: Hearing and external ear normal.     Nose: Nose normal.     Mouth/Throat:     Lips: Pink.  Eyes:     General: Lids are normal. Vision grossly intact. Gaze aligned appropriately.     Extraocular Movements: Extraocular movements intact.     Conjunctiva/sclera: Conjunctivae normal.  Pulmonary:     Effort: Pulmonary effort is normal.  Musculoskeletal:     Right wrist: Swelling, tenderness (diffuse) and bony tenderness present. No deformity, effusion, lacerations, snuff box tenderness or crepitus. Decreased range of motion (secondary to pain). Normal pulse.     Left wrist: Normal.     Cervical back: Neck supple.  Skin:    General: Skin is warm and dry.     Capillary Refill: Capillary refill takes less than  2  seconds.     Findings: No rash.  Neurological:     General: No focal deficit present.     Mental Status: She is alert and oriented to person, place, and time. Mental status is at baseline.     Cranial Nerves: No dysarthria or facial asymmetry.  Psychiatric:        Mood and Affect: Mood normal.        Speech: Speech normal.        Behavior: Behavior normal.        Thought Content: Thought content normal.        Judgment: Judgment normal.      UC Treatments / Results  Labs (all labs ordered are listed, but only abnormal results are displayed) Labs Reviewed - No data to display  EKG   Radiology DG Wrist Complete Right  Result Date: 09/13/2022 CLINICAL DATA:  Injury. History of arthritis. Pain for 2 months. No known trauma. EXAM: RIGHT WRIST - COMPLETE 3+ VIEW COMPARISON:  None Available. FINDINGS: There is 1 mm ulnar positive variance. Moderate distal radioulnar joint space narrowing and peripheral osteophytosis. Severe radiocarpal joint space narrowing with bone-on-bone contact at the lunate. Mild chronic degenerate erosion at the proximal lateral aspect of the lunate at the abutment of the distal radius with degenerative subchondral sclerosis and cystic changes on both sides. There is also abutment of the proximal medial lunate by the distal ulna, with subchondral sclerosis and cysts on both sides. Severe capitate-scaphoid and lunate joint space narrowing with diffuse bone-on-bone contact. Moderate medial and mild lateral triscaphe joint space narrowing. Moderate second and third and mild fourth and fifth carpometacarpal joint space narrowing. No acute fracture is seen.  No dislocation. IMPRESSION: 1. Severe radiocarpal, capitate-scaphoid, and capitate-lunate joint space narrowing with diffuse bone-on-bone contact, subchondral sclerosis, and cystic changes. 2. Moderate distal radioulnar joint space narrowing. Electronically Signed   By: Neita Garnet M.D.   On: 09/13/2022 20:59     Procedures Procedures (including critical care time)  Medications Ordered in UC Medications - No data to display  Initial Impression / Assessment and Plan / UC Course  I have reviewed the triage vital signs and the nursing notes.  Pertinent labs & imaging results that were available during my care of the patient were reviewed by me and considered in my medical decision making (see chart for details).   1. Osteoarthritis of right wrist X-ray of the right wrist shows significant degenerative changes with diffuse joint space narrowing resulting in bone on bone contact. This is a chronic problem. Since symptoms have not responded well to OTC treatments, will provide steroid burst to be started tomorrow to reduce inflammation and pain. Encouraged patient to follow-up with [facility] for ongoing evaluation and management of this. May use wrist brace as needed for comfort and support. She is neurovascularly intact distally to pain and agreeable with plan.   2. Essential hypertension BP elevated today. Amlodipine 5mg  daily sent to pharmacy to be taken as prescribed.  Discussed risks of sustained elevated blood pressure without intervention.  Also discussed DASH diet as well as increasing exercise and attempt to naturally reduce blood pressure.  Advised to schedule an appointment with PCP for ongoing management of hypertension.  PCP assistance initiated today. No red flag signs/symptoms indicating need for referral to ED currently as patient is neurologically intact to baseline.    Discussed physical exam and available lab work findings in clinic with patient.  Counseled patient regarding appropriate  use of medications and potential side effects for all medications recommended or prescribed today. Discussed red flag signs and symptoms of worsening condition,when to call the PCP office, return to urgent care, and when to seek higher level of care in the emergency department. Patient  verbalizes understanding and agreement with plan. All questions answered. Patient discharged in stable condition.    Final Clinical Impressions(s) / UC Diagnoses   Final diagnoses:  Essential hypertension  Osteoarthritis of right wrist, unspecified osteoarthritis type   Discharge Instructions   None    ED Prescriptions     Medication Sig Dispense Auth. Provider   amLODipine (NORVASC) 5 MG tablet Take 1 tablet (5 mg total) by mouth daily. 30 tablet Reita May M, FNP   predniSONE (DELTASONE) 20 MG tablet Take 2 tablets (40 mg total) by mouth daily for 5 days. 10 tablet Carlisle Beers, FNP      PDMP not reviewed this encounter.   Reita May [place], [place] 09/17/22 [phone]

## 2022-09-13 NOTE — ED Triage Notes (Signed)
Pt c/o right wrist pain since Thursday. Rotates ice and heat. Denies injury.

## 2022-09-15 DIAGNOSIS — M25531 Pain in right wrist: Secondary | ICD-10-CM | POA: Diagnosis not present

## 2022-09-17 DIAGNOSIS — I10 Essential (primary) hypertension: Secondary | ICD-10-CM

## 2022-09-21 DIAGNOSIS — R200 Anesthesia of skin: Secondary | ICD-10-CM | POA: Diagnosis not present

## 2022-09-21 DIAGNOSIS — R202 Paresthesia of skin: Secondary | ICD-10-CM | POA: Diagnosis not present

## 2022-09-21 DIAGNOSIS — M19031 Primary osteoarthritis, right wrist: Secondary | ICD-10-CM | POA: Diagnosis not present

## 2022-11-02 ENCOUNTER — Ambulatory Visit: Payer: Medicaid Other | Admitting: Nurse Practitioner

## 2023-01-05 ENCOUNTER — Encounter: Payer: Self-pay | Admitting: Student

## 2023-01-05 ENCOUNTER — Ambulatory Visit (INDEPENDENT_AMBULATORY_CARE_PROVIDER_SITE_OTHER): Payer: Medicaid Other | Admitting: Student

## 2023-01-05 ENCOUNTER — Ambulatory Visit (HOSPITAL_COMMUNITY)
Admission: RE | Admit: 2023-01-05 | Discharge: 2023-01-05 | Disposition: A | Discharge status: Home / Self Care | Payer: Medicaid Other | Source: Ambulatory Visit | Attending: Internal Medicine | Admitting: Internal Medicine

## 2023-01-05 VITALS — BP 168/102 | HR 75 | Temp 98.3°F | Ht 66.0 in | Wt 175.4 lb

## 2023-01-05 DIAGNOSIS — I10 Essential (primary) hypertension: Secondary | ICD-10-CM

## 2023-01-05 DIAGNOSIS — R0602 Shortness of breath: Secondary | ICD-10-CM

## 2023-01-05 DIAGNOSIS — Z23 Encounter for immunization: Secondary | ICD-10-CM

## 2023-01-05 DIAGNOSIS — H9210 Otorrhea, unspecified ear: Secondary | ICD-10-CM | POA: Insufficient documentation

## 2023-01-05 DIAGNOSIS — Z0000 Encounter for general adult medical examination without abnormal findings: Secondary | ICD-10-CM

## 2023-01-05 DIAGNOSIS — M1990 Unspecified osteoarthritis, unspecified site: Secondary | ICD-10-CM | POA: Insufficient documentation

## 2023-01-05 DIAGNOSIS — H9211 Otorrhea, right ear: Secondary | ICD-10-CM

## 2023-01-05 DIAGNOSIS — M1380 Other specified arthritis, unspecified site: Secondary | ICD-10-CM

## 2023-01-05 DIAGNOSIS — M0579 Rheumatoid arthritis with rheumatoid factor of multiple sites without organ or systems involvement: Secondary | ICD-10-CM | POA: Insufficient documentation

## 2023-01-05 DIAGNOSIS — I011 Acute rheumatic endocarditis: Secondary | ICD-10-CM | POA: Insufficient documentation

## 2023-01-05 MED ORDER — AMLODIPINE BESYLATE 5 MG PO TABS: 5.0000 mg | ORAL_TABLET | Freq: Every day | ORAL | 2 refills | Status: DC

## 2023-01-05 NOTE — Patient Instructions (Signed)
Thank you, Ms.Leylany A Hartung for allowing [place] to provide your care today. Today we discussed joint pain, blood pressure, and ear problems.    I have ordered the following labs for you:   Lab Orders         CMP14 + Anion Gap         CBC no Diff         Rheumatoid (RA) Factor         CYCLIC CITRUL PEPTIDE ANTIBODY, IGG/IGA         ANA, IFA (with reflex)      Referrals ordered today:    Referral Orders         Ambulatory referral to ENT         Ambulatory referral to Rheumatology      I have ordered the following medication/changed the following medications:   Stop the following medications: Medications Discontinued During This Encounter  Medication Reason   oxyCODONE-acetaminophen (PERCOCET/ROXICET) 5-325 MG tablet    meclizine (ANTIVERT) 25 MG tablet    methocarbamol (ROBAXIN) 500 MG tablet    amLODipine (NORVASC) 5 MG tablet      Start the following medications: Meds ordered this encounter  Medications   amLODipine (NORVASC) 5 MG tablet    Sig: Take 1 tablet (5 mg total) by mouth daily.    Dispense:  90 tablet    Refill:  2     Follow up:  With Dr. Rosaura Carpenter at next appointment in about 2 weeks.     We look forward to seeing you next time. Please call our clinic at [phone] if you have any questions or concerns. The best time to call is Monday-Friday from 9am-4pm, but there is someone available 24/7. If after hours or the weekend, call the main hospital number and ask for the Internal Medicine Resident On-Call. If you need medication refills, please notify your pharmacy one week in advance and they will send [place] a request.   Thank you for trusting me with your care. Wishing you the best!  Lovie Macadamia MD [facility]

## 2023-01-05 NOTE — Assessment & Plan Note (Signed)
Patient was started on amlodipine 5 mg at latest ED visit due to hypertension.  She ran out of her medications prior to this appointment.  Blood pressure today 168/102 systolic.  Her blood pressure may be due to essential hypertension, but given the findings concerning for inflammatory arthropathy secondary hypertension due to renal involvement should be ruled out. Plan: CMP Refill amlodipine 5

## 2023-01-05 NOTE — Assessment & Plan Note (Addendum)
Last Pap: Negative HPV, NILM, 2017 Mammogram: Never - Referral placed Colonoscopy: Never TDAP: Last 2017 next due 2027 A1C: Never Lipid: Never Flu vaccine given today
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
## 2023-01-05 NOTE — Assessment & Plan Note (Addendum)
Patient says she has a extensive history of arthritis.  She says when she was a teenager, she had a great deal of pain after playing sports.  She went to the doctor and was told she has arthritis, but is unsure of any specifics.  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  She has left elbow pain and swelling, weakness of the left shoulder, and recurrent swelling of the knees.  Her pain is worse in the morning and she wakes up early in order to begin getting herself going and to try to work out some of her the stiffness.  The pain has become debilitating to the point where she needs help from her daughter in order to complete tasks.  She endorses occasional ulcers in her nose, but denies any ulcers in the mouth.  At times she gets pruritic rashes and sometimes spots that look like bruising on the arms.  She denies malar rash or history of facial rash.  She denies fevers, endorses night sweats and chills.  Denies hair loss. She has a sister with lupus, but otherwise no history of autoimmune disorders in the family that she knows of.  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  We will begin a basic rheum workup, but even if negative, this patient would likely benefit from rheum follow-up.  Plan: CBC Rheumatoid factor, anti-CCP ANA Rheumatology referral
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
## 2023-01-06 LAB — CBC
Hematocrit: 36.4 % (ref 34.0–46.6)
Hemoglobin: 11.5 g/dL (ref 11.1–15.9)
MCH: 28.2 pg (ref 26.6–33.0)
MCHC: 31.6 g/dL (ref 31.5–35.7)
MCV: 89 fL (ref 79–97)
Platelets: 306 10*3/uL (ref 150–450)
RBC: 4.08 x10E6/uL (ref 3.77–5.28)
RDW: 12 % (ref 11.7–15.4)
WBC: 6.4 10*3/uL (ref 3.4–10.8)

## 2023-01-06 LAB — CMP14 + ANION GAP
ALT: 8 IU/L (ref 0–32)
AST: 14 IU/L (ref 0–40)
Albumin: 4 g/dL (ref 3.9–4.9)
Alkaline Phosphatase: 85 IU/L (ref 44–121)
Anion Gap: 13 mmol/L (ref 10.0–18.0)
BUN/Creatinine Ratio: 15 (ref 9–23)
BUN: 11 mg/dL (ref 6–24)
Bilirubin Total: <0.2 mg/dL (ref 0.0–1.2)
CO2: 20 mmol/L (ref 20–29)
Calcium: 9.3 mg/dL (ref 8.7–10.2)
Chloride: 104 mmol/L (ref 96–106)
Creatinine, Ser: 0.73 mg/dL (ref 0.57–1.00)
Globulin, Total: 3 g/dL (ref 1.5–4.5)
Glucose: 86 mg/dL (ref 70–99)
Potassium: 4.1 mmol/L (ref 3.5–5.2)
Sodium: 137 mmol/L (ref 134–144)
Total Protein: 7 g/dL (ref 6.0–8.5)
eGFR: 102 mL/min/{1.73_m2} (ref >59)

## 2023-01-06 LAB — RHEUMATOID FACTOR

## 2023-01-07 ENCOUNTER — Other Ambulatory Visit: Payer: Self-pay | Admitting: Student

## 2023-01-07 ENCOUNTER — Encounter (INDEPENDENT_AMBULATORY_CARE_PROVIDER_SITE_OTHER): Payer: Self-pay

## 2023-01-07 MED ORDER — PREDNISONE 5 MG PO TABS: ORAL_TABLET | ORAL | 0 refills | Status: AC

## 2023-01-07 NOTE — Progress Notes (Signed)
I spoke with Rheumatology regarding this patient. They will try and get her seen sooner (currently scheduled to be seen in March 2025). In the meantime they asked that we begin her on Prednisone 20mg  and taper down over 4 weeks. I attempted to call the patient w/o answer. I will reach out to them again to try and discuss this with them.

## 2023-01-08 LAB — ANTINUCLEAR ANTIBODIES, IFA

## 2023-01-08 LAB — CYCLIC CITRUL PEPTIDE ANTIBODY, IGG/IGA: Cyclic Citrullin Peptide Ab: >250 U — ABNORMAL HIGH (ref 0–19)

## 2023-01-10 ENCOUNTER — Other Ambulatory Visit: Payer: Self-pay | Admitting: Student

## 2023-01-10 ENCOUNTER — Other Ambulatory Visit: Payer: Self-pay | Admitting: Obstetrics and Gynecology

## 2023-01-10 DIAGNOSIS — Z1231 Encounter for screening mammogram for malignant neoplasm of breast: Secondary | ICD-10-CM

## 2023-01-19 NOTE — Progress Notes (Signed)
Internal Medicine Clinic Attending  I was physically present during the key portions of the resident provided service and participated in the medical decision making of patient's management care. I reviewed pertinent patient test results.  The assessment, diagnosis, and plan were formulated together and I agree with the documentation in the resident's note.  Narendra, Nischal, MD  

## 2023-01-20 DIAGNOSIS — Z1231 Encounter for screening mammogram for malignant neoplasm of breast: Secondary | ICD-10-CM

## 2023-01-25 ENCOUNTER — Ambulatory Visit: Payer: Medicaid Other | Admitting: Student

## 2023-01-25 ENCOUNTER — Encounter: Payer: Self-pay | Admitting: Student

## 2023-01-25 ENCOUNTER — Other Ambulatory Visit: Payer: Self-pay

## 2023-01-25 VITALS — BP 171/103 | HR 70 | Temp 98.3°F | Ht 67.0 in | Wt 175.0 lb

## 2023-01-25 DIAGNOSIS — I10 Essential (primary) hypertension: Secondary | ICD-10-CM

## 2023-01-25 DIAGNOSIS — I011 Acute rheumatic endocarditis: Secondary | ICD-10-CM

## 2023-01-25 DIAGNOSIS — Z0000 Encounter for general adult medical examination without abnormal findings: Secondary | ICD-10-CM

## 2023-01-25 DIAGNOSIS — Z1231 Encounter for screening mammogram for malignant neoplasm of breast: Secondary | ICD-10-CM

## 2023-01-25 DIAGNOSIS — Z01812 Encounter for preprocedural laboratory examination: Secondary | ICD-10-CM | POA: Diagnosis not present

## 2023-01-25 DIAGNOSIS — B9681 Helicobacter pylori [H. pylori] as the cause of diseases classified elsewhere: Secondary | ICD-10-CM | POA: Diagnosis not present

## 2023-01-25 DIAGNOSIS — R1013 Epigastric pain: Secondary | ICD-10-CM | POA: Diagnosis not present

## 2023-01-25 DIAGNOSIS — M069 Rheumatoid arthritis, unspecified: Secondary | ICD-10-CM | POA: Diagnosis not present

## 2023-01-25 DIAGNOSIS — Z136 Encounter for screening for cardiovascular disorders: Secondary | ICD-10-CM | POA: Diagnosis not present

## 2023-01-25 DIAGNOSIS — R109 Unspecified abdominal pain: Secondary | ICD-10-CM | POA: Insufficient documentation

## 2023-01-25 MED ORDER — PANTOPRAZOLE SODIUM 40 MG PO TBEC: 40.0000 mg | DELAYED_RELEASE_TABLET | Freq: Every day | ORAL | 3 refills | Status: DC

## 2023-01-25 NOTE — Patient Instructions (Signed)
Thank you, [name] for allowing [place] to provide your care today.  I have ordered the following tests for you:   Lab Orders         Lipid Profile         H.pylori screen, POC       Referrals ordered today:    Referral Orders         Amb Referral to Colonoscopy      I have ordered the following medication/changed the following medications:   Stop the following medications: There are no discontinued medications.   Start the following medications: Meds ordered this encounter  Medications   pantoprazole (PROTONIX) 40 MG tablet    Sig: Take 1 tablet (40 mg total) by mouth daily.    Dispense:  30 tablet    Refill:  3       Follow up: 2-3 months With Dr. Rosaura Carpenter  Please remember: Do not take Protonix until you do your H.Pylori test   We look forward to seeing you next time. Please call our clinic at [phone] if you have any questions or concerns. The best time to call is Monday-Friday from 9am-4pm, but there is someone available 24/7. If after hours or the weekend, call the main hospital number and ask for the Internal Medicine Resident On-Call. If you need medication refills, please notify your pharmacy one week in advance and they will send [place] a request.   Thank you for trusting me with your care. Wishing you the best!  Jill Macadamia MD [facility]

## 2023-01-25 NOTE — Assessment & Plan Note (Signed)
Hypertensive today current on amlodipine 5.  BMP last visit unremarkable.  She states that she did not take her medication for this visit. Encouraged the patient to to take her medication prior to next visit.

## 2023-01-25 NOTE — Assessment & Plan Note (Addendum)
Colonoscopy referral, lipid panel today. She was due for a Pap smear, deferred Pap smear at her next visit per patient.

## 2023-01-25 NOTE — Assessment & Plan Note (Signed)
Currently on prednisone taper, patient is doing much much better.  Much of her pain and edema has resolved.  Her range of motion is improved.  She denies any side effects from this medication at this time.

## 2023-01-25 NOTE — Progress Notes (Signed)
    Subjective:  CC: Abdominal pain  HPI:  [name] is a 47 y.o. person with a past medical history stated below and presents today for abdominal pain. Please see problem based assessment and plan for additional details.  Past Medical History:  Diagnosis Date   Hypertension    Vaginal Pap smear, abnormal    LEEP, ok since    Current Outpatient Medications on File Prior to Visit  Medication Sig Dispense Refill   acetaminophen (TYLENOL) 500 MG tablet Take 1,000 mg by mouth every 6 (six) hours as needed for moderate pain.      amLODipine (NORVASC) 5 MG tablet Take 1 tablet (5 mg total) by mouth daily. 90 tablet 2   calcium carbonate (TUMS - DOSED IN MG ELEMENTAL CALCIUM) 500 MG chewable tablet Chew 2 tablets by mouth 3 (three) times daily as needed for indigestion or heartburn.     naproxen (NAPROSYN) 500 MG tablet Take 1 tablet (500 mg total) by mouth 2 (two) times daily. 30 tablet 0   predniSONE (DELTASONE) 5 MG tablet Take 4 tablets (20 mg total) by mouth daily with breakfast for 7 days, THEN 3 tablets (15 mg total) daily with breakfast for 7 days, THEN 2 tablets (10 mg total) daily with breakfast for 7 days, THEN 1 tablet (5 mg total) daily with breakfast for 7 days. 70 tablet 0   No current facility-administered medications on file prior to visit.    Review of Systems: Please see assessment and plan for pertinent positives and negatives.  Objective:   Vitals:   01/25/23 1532 01/25/23 1617  BP: (!) 150/104 (!) 171/103  Pulse: 86 70  Temp: 98.3 F (36.8 C)   TempSrc: Oral   SpO2: 100%   Weight: 175 lb (79.4 kg)   Height: 5\' 7"  (1.702 m)     Physical Exam: Constitutional: Well-appearing, in no acute distress Cardiovascular: regular rate and rhythm, no m/r/g Pulmonary/Chest: normal work of breathing on room air, lungs clear to auscultation bilaterally Abdominal: Epigastric and periumbilical abdominal pain with palpation.   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  Much of her pain and edema has resolved.  Her range of motion is improved.  She denies any side effects from this medication at this time.  Healthcare maintenance Colonoscopy referral, lipid panel today. She was due for a Pap smear, deferred Pap smear at her next visit per patient.  Essential hypertension Hypertensive today current on amlodipine 5.  BMP last visit unremarkable.  She states that she did not take her medication for this visit. Encouraged the patient to to take her medication prior to next visit.  Abdominal pain Patient endorses epigastric abdominal pain ongoing for many years.  It is associated with bloating, nausea, occasional vomiting.  Denies constipation or diarrhea.  Denies hematemesis.  Denies dysphagia.  She says the pain is epigastric in nature and occasionally has acid reflux-like symptoms.  She does not note worsening or improvement of the pain with eating.  She says spicy foods are an aggravating factor for her. Plan: H. pylori breath test today Protonix 40 mg daily    Patient seen with Dr. Heide Scales MD [facility]  PGY-1 Pager: [phone]  Phone: [phone] Date 01/25/2023  Time 8:46 PM

## 2023-01-25 NOTE — Assessment & Plan Note (Signed)
Patient endorses epigastric abdominal pain ongoing for many years.  It is associated with bloating, nausea, occasional vomiting.  Denies constipation or diarrhea.  Denies hematemesis.  Denies dysphagia.  She says the pain is epigastric in nature and occasionally has acid reflux-like symptoms.  She does not note worsening or improvement of the pain with eating.  She says spicy foods are an aggravating factor for her. Plan: H. pylori breath test today Protonix 40 mg daily

## 2023-01-26 LAB — LIPID PANEL
Chol/HDL Ratio: 2.5 {ratio} (ref 0.0–4.4)
Cholesterol, Total: 181 mg/dL (ref 100–199)
HDL: 71 mg/dL (ref >39)
LDL Chol Calc (NIH): 93 mg/dL (ref 0–99)
Triglycerides: 94 mg/dL (ref 0–149)
VLDL Cholesterol Cal: 17 mg/dL (ref 5–40)

## 2023-01-26 LAB — H. PYLORI BREATH TEST: H pylori Breath Test: POSITIVE — AB

## 2023-01-27 NOTE — Progress Notes (Signed)
Internal Medicine Clinic Attending  I was physically present during the key portions of the resident provided service and participated in the medical decision making of patient's management care. I reviewed pertinent patient test results.  The assessment, diagnosis, and plan were formulated together and I agree with the documentation in the resident's note.  Of note, her H pylori breath test is positive. She will need to be initiated on quadruple therapy and follow up for repeat breath test to ensure clearance.   Earl Lagos, MD

## 2023-01-28 ENCOUNTER — Telehealth: Payer: Self-pay | Admitting: Student

## 2023-01-28 DIAGNOSIS — R1013 Epigastric pain: Secondary | ICD-10-CM

## 2023-02-01 MED ORDER — BISMUTH/METRONIDAZ/TETRACYCLIN 140-125-125 MG PO CAPS: 3.0000 | ORAL_CAPSULE | Freq: Four times a day (QID) | ORAL | 0 refills | Status: AC

## 2023-02-01 MED ORDER — PANTOPRAZOLE SODIUM 40 MG PO TBEC: 40.0000 mg | DELAYED_RELEASE_TABLET | Freq: Two times a day (BID) | ORAL | 3 refills | Status: DC

## 2023-02-01 NOTE — Telephone Encounter (Signed)
I spoke with Jill Bright on the phone. Patient's identity was confirmed using two patient specific identifiers. We discussed her lab results. She is h.pylori positive, will start quadruple therapy for 10 days. Will follow up in 1-2 months for test of cure.

## 2023-02-03 ENCOUNTER — Institutional Professional Consult (permissible substitution) (INDEPENDENT_AMBULATORY_CARE_PROVIDER_SITE_OTHER): Payer: Medicaid Other

## 2023-02-07 ENCOUNTER — Other Ambulatory Visit: Payer: Self-pay | Admitting: *Deleted

## 2023-02-07 DIAGNOSIS — R1013 Epigastric pain: Secondary | ICD-10-CM

## 2023-02-07 MED ORDER — PANTOPRAZOLE SODIUM 40 MG PO TBEC: 40.0000 mg | DELAYED_RELEASE_TABLET | Freq: Two times a day (BID) | ORAL | 3 refills | Status: AC

## 2023-02-07 NOTE — Telephone Encounter (Signed)
Call from pt - states [facility] told her Medicaid will not pay for Pantoprazole; cost $49.00. Pt requesting new rx sent to [facility] on [address]. Thanks

## 2023-02-09 ENCOUNTER — Telehealth: Payer: Self-pay

## 2023-02-09 NOTE — Telephone Encounter (Signed)
Requesting to speak with a nurse about getting  refill on  predniSONE (DELTASONE) 5 MG tablet. Please call pt back.

## 2023-02-09 NOTE — Telephone Encounter (Signed)
RTC to patient. No answer. VM not set up.  Unable to leave a message.

## 2023-02-10 ENCOUNTER — Other Ambulatory Visit: Payer: Self-pay | Admitting: Student

## 2023-02-10 ENCOUNTER — Other Ambulatory Visit: Payer: Self-pay | Admitting: *Deleted

## 2023-02-10 DIAGNOSIS — M0520 Rheumatoid vasculitis with rheumatoid arthritis of unspecified site: Secondary | ICD-10-CM

## 2023-02-10 MED ORDER — PREDNISONE 5 MG PO TABS: 10.0000 mg | ORAL_TABLET | Freq: Every day | ORAL | 0 refills | Status: DC

## 2023-02-10 NOTE — Progress Notes (Signed)
I spoke with Glynda Jaeger on the phone. Patient's identity was confirmed using two patient specific identifiers. We discussed her prednisone prescription.  She recently finished her taper, she started noting that she had increased pain when she tapered down to 5 mg, but had relatively good control on 10 mg.  We will go ahead and resume her on 10 mg in the interim until she sees rheumatology.  No GI symptoms such as diarrhea at this point.  Risks and benefits of prolonged steroid therapy discussed.  This case was discussed with Dr. Lafonda Mosses.

## 2023-02-10 NOTE — Telephone Encounter (Signed)
Return call from pt who stated she had called yesterday about medication and have not heard back from the doctor and wanting know why not. I explained to her the nurse had called her back yesterday to get more details but no answer (see note). Stated she needs a refill on Prednisone for pain and does not see the arthritis doctor until next month. Stated she had told 3 people why she needed the refill and now she had been placed on hold. I told her I had placed her on hold (I told her "to hold please") b/c I was finishing  a previous call. As I was trying to explained why I was asking her questions (I did not talk to her yesterday; and I needed more information) - I noticed no one was on phone; she had hung up the phone. I called her back, pt was upset, I tried to explain again and I'm only trying to help. 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 I transferred call to our Practice Administrator - she stated pt would not talk to her.

## 2023-02-24 ENCOUNTER — Encounter: Payer: Medicaid Other | Admitting: Obstetrics and Gynecology

## 2023-03-03 NOTE — Progress Notes (Signed)
Office Visit Note  Patient: Jill Bright             Date of Birth: 1975/06/25           MRN: [id number]             PCP: Lovie Macadamia, MD Referring: Earl Lagos, MD Visit Date: 03/04/2023 Occupation: [facility]  Subjective:  New Patient (Initial Visit) (Patient states she has joint pain in her hands, elbow, shoulders, and upper body. Patient states her right hand and wrist are bad and have sharp pains up to the elbow. )    Discussed the use of AI scribe software for clinical note transcription with the patient, who gave verbal consent to proceed.   History of Present Illness   Jill Bright is a 47 y.o. female with a long-standing history of joint problems and chronic right ear postraumatic drainage, is here for evaluation of inflammatory arthritis. She reports a significant increase in symptoms over the past two years. They describe persistent joint swelling, pain, and loss of grip strength, which have been present for most of their life but have worsened since turning 45.  The patient's mother had mentioned that they were born with arthritis. However, the patient's symptoms have become more debilitating recently, affecting their ability to work and perform daily activities.  She had evaluation in internal medicine clinic including x-rays and lab tests these demonstrated erosive disease throughout the right wrist  and positive serology with CCP >250. She was started on prednisone for joint inflammation with a good improvement. Despite taking prednisone, the patient continues to experience joint swelling and pain, but notes that the medication allows them to function and work, albeit with discomfort. Currently on 10 mg daily, decreasing to 5 makes symptoms to severe for her to work. The patient also reports needing to take ibuprofen in addition to prednisone to manage their pain, or has to take 4-6 when she was off the prednisone. They express concern about the long-term  complications of steroid use. She does not experience palpable or painful lymph node swelling.  She does have a persistent nodule on the back of her neck that has been there for years. They have a history of smoking but quit seven years ago.   Labs reviewed ANA neg RF 25.7 CCP >250  Imaging reviewed 09/13/22 Xray right wrist IMPRESSION: 1. Severe radiocarpal, capitate-scaphoid, and capitate-lunate joint space narrowing with diffuse bone-on-bone contact, subchondral sclerosis, and cystic changes. 2. Moderate distal radioulnar joint space narrowing.  Activities of Daily Living:  Patient reports morning stiffness for 45-60 minutes.   Patient Reports nocturnal pain.  Difficulty dressing/grooming: Reports Difficulty climbing stairs: Reports Difficulty getting out of chair: Reports Difficulty using hands for taps, buttons, cutlery, and/or writing: Reports  Review of Systems  Constitutional:  Positive for fatigue.  HENT:  Negative for mouth sores and mouth dryness.   Eyes:  Negative for dryness.  Respiratory:  Negative for shortness of breath.   Cardiovascular:  Negative for chest pain and palpitations.  Gastrointestinal:  Negative for blood in stool, constipation and diarrhea.  Endocrine: Negative for increased urination.  Genitourinary:  Positive for involuntary urination.  Musculoskeletal:  Positive for joint pain, gait problem, joint pain, joint swelling, myalgias, muscle weakness, morning stiffness, muscle tenderness and myalgias.  Skin:  Negative for color change, rash, hair loss and sensitivity to sunlight.  Allergic/Immunologic: Negative for susceptible to infections.  Neurological:  Negative for dizziness and headaches.  Hematological:  Negative for swollen  glands.  Psychiatric/Behavioral:  Positive for sleep disturbance. Negative for depressed mood. The patient is nervous/anxious.     PMFS History:  Patient Active Problem List   Diagnosis Date Noted   Abdominal pain  01/25/2023   Healthcare maintenance 01/05/2023   Rheumatoid arthritis with rheumatoid factor of multiple sites without organ or systems involvement (HCC) 01/05/2023   Ear discharge 01/05/2023   Essential hypertension 09/17/2022   Corpus luteum cyst 01/30/2012    Past Medical History:  Diagnosis Date   Hypertension    Rheumatoid arthritis (HCC)    Vaginal Pap smear, abnormal    LEEP, ok since    Family History  Problem Relation Age of Onset   Diabetes Mother    Hypertension Mother    Lupus Sister    Past Surgical History:  Procedure Laterality Date   CESAREAN SECTION N/A 01/31/2016   Procedure: CESAREAN SECTION;  Surgeon: Essie Hart, MD;  Location: [facility];  Service: Obstetrics;  Laterality: N/A;   CHOLECYSTECTOMY     DILATION AND CURETTAGE OF UTERUS     THERAPEUTIC ABORTION     Social History   Social History Narrative   Not on file   Immunization History  Administered Date(s) Administered   Influenza, Seasonal, Injecte, Preservative Fre 01/05/2023   Influenza,inj,Quad PF,6+ Mos 01/27/2016   Tdap 01/31/2016     Objective: Vital Signs: BP (!) 147/91 (BP Location: Right Arm, Patient Position: Sitting, Cuff Size: Normal)   Pulse 68   Resp 14   Ht 5\' 6"  (1.676 m)   Wt 178 lb (80.7 kg)   LMP 02/25/2023   BMI 28.73 kg/m    Physical Exam Eyes:     Conjunctiva/sclera: Conjunctivae normal.  Cardiovascular:     Rate and Rhythm: Normal rate and regular rhythm.  Pulmonary:     Effort: Pulmonary effort is normal.     Breath sounds: Normal breath sounds.  Lymphadenopathy:     Cervical: No cervical adenopathy.  Skin:    General: Skin is warm and dry.     Comments: Firm mobile nontender nodule in the skin on base of the neck  Neurological:     Mental Status: She is alert.  Psychiatric:        Mood and Affect: Mood normal.      Musculoskeletal Exam:  Shoulders full range of motion tenderness to pressure bilateral with no palpable swelling  Bilateral  elbow tenderness to pressure, full range of motion, mild swelling present at left elbow Right wrist severely limited flexion and extension range of motion with pain, left wrist range of motion normal Fingers full ROM, mild tenderness and no palpable swelling Knees full ROM no tenderness or swelling, bilateral patellofemoral crepitus Ankles full ROM no tenderness or swelling   Investigation: No additional findings.  Imaging: No results found.  Recent Labs: Lab Results  Component Value Date   WBC 6.4 01/05/2023   HGB 11.5 01/05/2023   PLT 306 01/05/2023   NA 137 01/05/2023   K 4.1 01/05/2023   CL 104 01/05/2023   CO2 20 01/05/2023   GLUCOSE 86 01/05/2023   BUN 11 01/05/2023   CREATININE 0.73 01/05/2023   BILITOT <0.2 01/05/2023   ALKPHOS 85 01/05/2023   AST 14 01/05/2023   ALT 8 01/05/2023   PROT 7.0 01/05/2023   ALBUMIN 4.0 01/05/2023   CALCIUM 9.3 01/05/2023   GFRAA >60 05/19/2017    Speciality Comments: No specialty comments available.  Procedures:  No procedures performed Allergies: Tea  Assessment / Plan:     Visit Diagnoses: Rheumatoid arthritis with rheumatoid factor of multiple sites without organ or systems involvement (HCC) - Plan: Sedimentation rate, C-reactive protein, CBC with Differential/Platelet, COMPLETE METABOLIC PANEL WITH GFR, Hepatitis B core antibody, IgM, Hepatitis B surface antigen, Hepatitis C antibody, QuantiFERON-TB Gold Plus  Clinical picture and findings so far appear highly consistent for seropositive erosive rheumatoid arthritis.  Not too much swelling on exam but she is on 10 mg prednisone.  We discussed the need to decrease prednisone long-term to prevent serious complications and that this treatment is less effective in preventing joint degeneration over time.  Checking sed rate and CRP for inflammatory disease activity assessment.  After checking baseline labs if appropriate plan to start on methotrexate 15 mg p.o. weekly and folic acid 1  mg daily.  Can continue prednisone 10 mg daily for now until follow-up.  High risk medication use  Checking CBC and CMP hepatitis B and C serology and QuantiFERON baseline screening anticipating plan to start methotrexate or if need a biologic DMARD.  Reviewed risk of medication including cytopenias hepatotoxicity and infection risk.  She does not drink alcohol.  She is a former smoker but has already quit since 7 years ago.  Osteoarthritis of both knees, unspecified osteoarthritis type  Patellofemoral crepitus present both knees but no definite effusions and not currently a primary complaint.  Orders: Orders Placed This Encounter  Procedures   Sedimentation rate   C-reactive protein   CBC with Differential/Platelet   COMPLETE METABOLIC PANEL WITH GFR   Hepatitis B core antibody, IgM   Hepatitis B surface antigen   Hepatitis C antibody   QuantiFERON-TB Gold Plus   Meds ordered this encounter  Medications   predniSONE (DELTASONE) 5 MG tablet    Sig: Take 2 tablets (10 mg total) by mouth daily with breakfast.    Dispense:  60 tablet    Refill:  0     Follow-Up Instructions: Return in about 5 weeks (around 04/08/2023) for New pt RA MTX start f/u 56mo.   Fuller Plan, MD  Note - This record has been created using [employer].  Chart creation errors have been sought, but may not always  have been located. Such creation errors do not reflect on  the standard of medical care.

## 2023-03-04 ENCOUNTER — Encounter: Payer: Self-pay | Admitting: Internal Medicine

## 2023-03-04 ENCOUNTER — Ambulatory Visit: Payer: Medicaid Other | Attending: Internal Medicine | Admitting: Internal Medicine

## 2023-03-04 VITALS — BP 147/91 | HR 68 | Resp 14 | Ht 66.0 in | Wt 178.0 lb

## 2023-03-04 DIAGNOSIS — Z79899 Other long term (current) drug therapy: Secondary | ICD-10-CM

## 2023-03-04 DIAGNOSIS — M170 Bilateral primary osteoarthritis of knee: Secondary | ICD-10-CM | POA: Diagnosis not present

## 2023-03-04 DIAGNOSIS — M0520 Rheumatoid vasculitis with rheumatoid arthritis of unspecified site: Secondary | ICD-10-CM | POA: Diagnosis not present

## 2023-03-04 DIAGNOSIS — M0579 Rheumatoid arthritis with rheumatoid factor of multiple sites without organ or systems involvement: Secondary | ICD-10-CM

## 2023-03-04 MED ORDER — PREDNISONE 5 MG PO TABS: 10.0000 mg | ORAL_TABLET | Freq: Every day | ORAL | 0 refills | Status: DC

## 2023-03-04 NOTE — Patient Instructions (Signed)
Methotrexate Tablets What is this medication? METHOTREXATE (METH oh TREX ate) treats autoimmune conditions, such as arthritis and psoriasis. It works by decreasing inflammation, which can reduce pain and prevent long-term injury to the joints and skin. It may also be used to treat some types of cancer. It works by slowing down the growth of cancer cells. This medicine may be used for other purposes; ask your health care provider or pharmacist if you have questions. COMMON BRAND NAME(S): Rheumatrex, Trexall What should I tell my care team before I take this medication? They need to know if you have any of these conditions: Dehydration Diabetes Fluid in the stomach area or lungs Frequently drink alcohol Having surgery, including dental surgery High cholesterol Immune system problems Inflammatory bowel disease, such as ulcerative colitis Kidney disease Liver disease Low blood cell levels (white cells, red cells, and platelets) Lung disease Recent or ongoing radiation Recent or upcoming vaccine Stomach ulcers, other stomach or intestine problems An unusual or allergic reaction to methotrexate, other medications, foods, dyes, or preservatives Pregnant or trying to get pregnant Breastfeeding How should I use this medication? Take this medication by mouth with water. Take it as directed on the prescription label. Do not take extra. Keep taking this medication until your care team tells you to stop. Know why you are taking this medication and how you should take it. To treat conditions such as arthritis and psoriasis, this medication is taken ONCE A WEEK as a single dose or divided into 3 smaller doses taken 12 hours apart (do not take more than 3 doses 12 hours apart each week). This medication is NEVER taken daily to treat conditions other than cancer. Taking this medication more often than directed can cause serious side effects, even death. Talk to your care team about why you are taking this  medication, how often you will take it, and what your dose is. Ask your care team to put the reason you take this medication on the prescription. If you take this medication ONCE A WEEK, choose a day of the week before you start. Ask your pharmacist to include the day of the week on the label. Avoid "Monday", which could be misread as "Morning". Handling this medication may be harmful. Talk to your care team about how to handle this medication. Special instructions may apply. Talk to your care team about the use of this medication in children. While it may be prescribed for selected conditions, precautions do apply. Overdosage: If you think you have taken too much of this medicine contact a poison control center or emergency room at once. NOTE: This medicine is only for you. Do not share this medicine with others. What if I miss a dose? If you miss a dose, talk with your care team. Do not take double or extra doses. What may interact with this medication? Do not take this medication with any of the following: Acitretin Live virus vaccines Probenecid This medication may also interact with the following: Alcohol Aspirin and aspirin-like medications Certain antibiotics, such as penicillin, neomycin, sulfamethoxazole; trimethoprim Certain medications for stomach problems, such as lansoprazole, omeprazole, pantoprazole Clozapine Cyclosporine Dapsone Folic acid Foscarnet NSAIDs, medications for pain and inflammation, such as ibuprofen or naproxen Phenytoin Pyrimethamine Steroid medications, such as prednisone or cortisone Tacrolimus Theophylline This list may not describe all possible interactions. Give your health care provider a list of all the medicines, herbs, non-prescription drugs, or dietary supplements you use. Also tell them if you smoke, drink alcohol, or use  illegal drugs. Some items may interact with your medicine. What should I watch for while using this medication? Visit your  care team for regular checks on your progress. It may be some time before you see the benefit from this medication. You may need blood work done while you are taking this medication. If your care team has also prescribed folic acid, they may instruct you to skip your folic acid dose on the day you take methotrexate. This medication can make you more sensitive to the sun. Keep out of the sun. If you cannot avoid being in the sun, wear protective clothing and sunscreen. Do not use sun lamps, tanning beds, or tanning booths. Check with your care team if you have severe diarrhea, nausea, and vomiting, or if you sweat a lot. The loss of too much body fluid may make it dangerous for you to take this medication. This medication may increase your risk of getting an infection. Call your care team for advice if you get a fever, chills, sore throat, or other symptoms of a cold or flu. Do not treat yourself. Try to avoid being around people who are sick. Talk to your care team about your risk of cancer. You may be more at risk for certain types of cancers if you take this medication. Talk to your care team if you or your partner may be pregnant. Serious birth defects can occur if you take this medication during pregnancy and for 6 months after the last dose. You will need a negative pregnancy test before starting this medication. Contraception is recommended while taking this medication and for 6 months after the last dose. Your care team can help you find the option that works for you. If your partner can get pregnant, use a condom during sex while taking this medication and for 3 months after the last dose. Do not breastfeed while taking this medication and for 1 week after the last dose. This medication may cause infertility. Talk to your care team if you are concerned about your fertility. What side effects may I notice from receiving this medication? Side effects that you should report to your care team as soon  as possible: Allergic reactions--skin rash, itching, hives, swelling of the face, lips, tongue, or throat Dry cough, shortness of breath or trouble breathing Infection--fever, chills, cough, sore throat, wounds that don't heal, pain or trouble when passing urine, general feeling of discomfort or being unwell Kidney injury--decrease in the amount of urine, swelling of the ankles, hands, or feet Liver injury--right upper belly pain, loss of appetite, nausea, light-colored stool, dark yellow or brown urine, yellowing skin or eyes, unusual weakness or fatigue Low red blood cell level--unusual weakness or fatigue, dizziness, headache, trouble breathing Pain, tingling, or numbness in the hands or feet, muscle weakness, change in vision, confusion or trouble speaking, loss of balance or coordination, trouble walking, seizures Redness, blistering, peeling, or loosening of the skin, including inside the mouth Stomach bleeding--bloody or black, tar-like stools, vomiting blood or brown material that looks like coffee grounds Stomach pain that is severe, does not away, or gets worse Unusual bruising or bleeding Side effects that usually do not require medical attention (report these to your care team if they continue or are bothersome): Diarrhea Dizziness Hair loss Nausea Pain, redness, or swelling with sores inside the mouth or throat Skin reactions on sun-exposed areas Vomiting This list may not describe all possible side effects. Call your doctor for medical advice about side effects. You  may report side effects to FDA at 1-800-FDA-1088. Where should I keep my medication? Keep out of the reach of children and pets. Store at room temperature between 20 and 25 degrees C (68 and 77 degrees F). Protect from light. Keep the container tightly closed. Get rid of any unused medication after the expiration date. To get rid of medications that are no longer needed or have expired: Take the medication to a  medication take-back program. Check with your pharmacy or law enforcement to find a location. If you cannot return the medication, ask your pharmacist or care team how to get rid of this medication safely. NOTE: This sheet is a summary. It may not cover all possible information. If you have questions about this medicine, talk to your doctor, pharmacist, or health care provider.  2024 Elsevier/Gold Standard (2022-04-20 00:00:00)

## 2023-03-08 LAB — QUANTIFERON-TB GOLD PLUS
Mitogen-NIL: >10 [IU]/mL
NIL: 0.03 [IU]/mL
QuantiFERON-TB Gold Plus: NEGATIVE
TB1-NIL: 0.01 [IU]/mL
TB2-NIL: 0.01 [IU]/mL

## 2023-03-08 LAB — CBC WITH DIFFERENTIAL/PLATELET
Absolute Lymphocytes: 3152 {cells}/uL (ref 850–3900)
Absolute Monocytes: 474 {cells}/uL (ref 200–950)
Basophils Absolute: 32 {cells}/uL (ref 0–200)
Basophils Relative: 0.4 %
Eosinophils Absolute: 24 {cells}/uL (ref 15–500)
Eosinophils Relative: 0.3 %
HCT: 40.1 % (ref 35.0–45.0)
Hemoglobin: 12.6 g/dL (ref 11.7–15.5)
MCH: 28.3 pg (ref 27.0–33.0)
MCHC: 31.4 g/dL — ABNORMAL LOW (ref 32.0–36.0)
MCV: 89.9 fL (ref 80.0–100.0)
MPV: 12 fL (ref 7.5–12.5)
Monocytes Relative: 6 %
Neutro Abs: 4219 {cells}/uL (ref 1500–7800)
Neutrophils Relative %: 53.4 %
Platelets: 273 10*3/uL (ref 140–400)
RBC: 4.46 10*6/uL (ref 3.80–5.10)
RDW: 13.1 % (ref 11.0–15.0)
Total Lymphocyte: 39.9 %
WBC: 7.9 10*3/uL (ref 3.8–10.8)

## 2023-03-08 LAB — COMPLETE METABOLIC PANEL WITH GFR
AG Ratio: 1.6 (calc) (ref 1.0–2.5)
ALT: 15 U/L (ref 6–29)
AST: 15 U/L (ref 10–35)
Albumin: 4.1 g/dL (ref 3.6–5.1)
Alkaline phosphatase (APISO): 64 U/L (ref 31–125)
BUN: 8 mg/dL (ref 7–25)
CO2: 29 mmol/L (ref 20–32)
Calcium: 9.2 mg/dL (ref 8.6–10.2)
Chloride: 104 mmol/L (ref 98–110)
Creat: 0.74 mg/dL (ref 0.50–0.99)
Globulin: 2.6 g/dL (ref 1.9–3.7)
Glucose, Bld: 74 mg/dL (ref 65–99)
Potassium: 3.6 mmol/L (ref 3.5–5.3)
Sodium: 139 mmol/L (ref 135–146)
Total Bilirubin: 0.2 mg/dL (ref 0.2–1.2)
Total Protein: 6.7 g/dL (ref 6.1–8.1)
eGFR: 100 mL/min/{1.73_m2} (ref >=60)

## 2023-03-08 LAB — HEPATITIS C ANTIBODY: Hepatitis C Ab: NONREACTIVE

## 2023-03-08 LAB — HEPATITIS B SURFACE ANTIGEN: Hepatitis B Surface Ag: NONREACTIVE

## 2023-03-08 LAB — SEDIMENTATION RATE: Sed Rate: 2 mm/h (ref 0–20)

## 2023-03-08 LAB — HEPATITIS B CORE ANTIBODY, IGM: Hep B C IgM: NONREACTIVE

## 2023-03-08 LAB — C-REACTIVE PROTEIN: CRP: <3 mg/L (ref <8.0)

## 2023-03-10 ENCOUNTER — Other Ambulatory Visit: Payer: Self-pay | Admitting: Internal Medicine

## 2023-03-10 DIAGNOSIS — M0520 Rheumatoid vasculitis with rheumatoid arthritis of unspecified site: Secondary | ICD-10-CM

## 2023-03-23 ENCOUNTER — Institutional Professional Consult (permissible substitution) (INDEPENDENT_AMBULATORY_CARE_PROVIDER_SITE_OTHER): Payer: Medicaid Other

## 2023-04-22 ENCOUNTER — Other Ambulatory Visit: Payer: Self-pay | Admitting: Internal Medicine

## 2023-04-22 ENCOUNTER — Encounter: Payer: Self-pay | Admitting: Internal Medicine

## 2023-04-22 DIAGNOSIS — M0520 Rheumatoid vasculitis with rheumatoid arthritis of unspecified site: Secondary | ICD-10-CM

## 2023-04-22 DIAGNOSIS — Z79899 Other long term (current) drug therapy: Secondary | ICD-10-CM

## 2023-04-25 ENCOUNTER — Other Ambulatory Visit: Payer: Self-pay | Admitting: *Deleted

## 2023-04-25 ENCOUNTER — Encounter: Payer: Medicaid Other | Admitting: Obstetrics and Gynecology

## 2023-04-25 MED ORDER — PREDNISONE 5 MG PO TABS: 10.0000 mg | ORAL_TABLET | Freq: Every day | ORAL | 1 refills | Status: DC

## 2023-04-25 MED ORDER — METHOTREXATE SODIUM 2.5 MG PO TABS: 15.0000 mg | ORAL_TABLET | ORAL | 1 refills | Status: DC

## 2023-04-25 MED ORDER — FOLIC ACID 1 MG PO TABS: 1.0000 mg | ORAL_TABLET | Freq: Every day | ORAL | 0 refills | Status: DC

## 2023-04-25 NOTE — Telephone Encounter (Signed)
 Last Fill: 03/04/2023  Next Visit: Due 04/08/2023. Message sent to the front to schedule.   Last Visit: 03/04/2023  Dx: Rheumatoid arthritis with rheumatoid factor of multiple sites without organ or systems involvement   Current Dose per office note on 03/04/2023: prednisone  10 mg daily for now until follow-up.   Okay to refill Prednisone ?

## 2023-04-25 NOTE — Telephone Encounter (Signed)
 Please schedule patient a follow up visit. Patient due 03/2023. Thanks!

## 2023-04-25 NOTE — Telephone Encounter (Signed)
LMOM for patient to schedule follow up appointment. °

## 2023-06-20 ENCOUNTER — Encounter: Payer: Medicaid Other | Admitting: Internal Medicine

## 2023-07-06 ENCOUNTER — Encounter

## 2023-07-28 ENCOUNTER — Other Ambulatory Visit: Payer: Self-pay | Admitting: Student

## 2023-07-28 ENCOUNTER — Other Ambulatory Visit: Payer: Self-pay | Admitting: Internal Medicine

## 2023-07-28 DIAGNOSIS — Z1231 Encounter for screening mammogram for malignant neoplasm of breast: Secondary | ICD-10-CM

## 2023-07-28 MED ORDER — FOLIC ACID 1 MG PO TABS: 1.0000 mg | ORAL_TABLET | Freq: Every day | ORAL | 1 refills | Status: AC

## 2023-07-28 NOTE — Telephone Encounter (Signed)
 Last Fill: 04/25/2023  Next Visit: 09/27/2023  Last Visit: 03/04/2023  Dx: Rheumatoid arthritis with rheumatoid factor of multiple sites without organ or systems involvement   Current Dose per refill note on 04/25/2023: folic acid 1 mg daily.   Okay to refill Folic Acid?

## 2023-08-03 ENCOUNTER — Encounter

## 2023-08-03 DIAGNOSIS — Z1231 Encounter for screening mammogram for malignant neoplasm of breast: Secondary | ICD-10-CM

## 2023-08-04 ENCOUNTER — Telehealth: Payer: Self-pay | Admitting: Internal Medicine

## 2023-08-04 NOTE — Telephone Encounter (Signed)
 Patient has been scheduled for 08/08/2023 at 8:00 am.

## 2023-08-04 NOTE — Telephone Encounter (Signed)
 Pt called stating she in so much pain and needs to see Dr. Rodell Citrin ASAP. I advised pt that Dr. Rodell Citrin has nothing available until her appt date. Pt stated she does not want to go to the hospital and if there is any way Dr. Rodell Citrin could send her a prescription of prednisone  while she waits to be seen.

## 2023-08-08 ENCOUNTER — Ambulatory Visit: Attending: Internal Medicine | Admitting: Internal Medicine

## 2023-08-08 ENCOUNTER — Encounter: Payer: Self-pay | Admitting: Internal Medicine

## 2023-08-08 VITALS — BP 155/92 | HR 76 | Resp 16 | Ht 66.0 in | Wt 189.0 lb

## 2023-08-08 DIAGNOSIS — M0520 Rheumatoid vasculitis with rheumatoid arthritis of unspecified site: Secondary | ICD-10-CM | POA: Diagnosis not present

## 2023-08-08 DIAGNOSIS — M0579 Rheumatoid arthritis with rheumatoid factor of multiple sites without organ or systems involvement: Secondary | ICD-10-CM | POA: Diagnosis not present

## 2023-08-08 DIAGNOSIS — E559 Vitamin D deficiency, unspecified: Secondary | ICD-10-CM | POA: Diagnosis not present

## 2023-08-08 DIAGNOSIS — Z79899 Other long term (current) drug therapy: Secondary | ICD-10-CM | POA: Diagnosis not present

## 2023-08-08 MED ORDER — METHOTREXATE SODIUM 2.5 MG PO TABS: 15.0000 mg | ORAL_TABLET | ORAL | 0 refills | Status: DC

## 2023-08-08 MED ORDER — PREDNISONE 5 MG PO TABS: ORAL_TABLET | ORAL | 1 refills | Status: AC

## 2023-08-08 NOTE — Progress Notes (Signed)
 Office Visit Note  Patient: Jill Bright             Date of Birth: 12-02-1975           MRN: [id number]             PCP: Sheree Dieter, MD Referring: Sheree Dieter, MD Visit Date: 08/08/2023   Subjective:  Follow-up (Pain assessment)   Discussed the use of AI scribe software for clinical note transcription with the patient, who gave verbal consent to proceed.  History of Present Illness   Jill Bright "Jill Bright" is a 48 year old female with seropositive rheumatoid arthritis who presents with persistent joint swelling and pain.  She has been experiencing persistent swelling and pain in her elbow for the past month. The swelling is constant, particularly in the morning, and she has difficulty with movement, sometimes being unable to roll over.  She experiences significant morning stiffness, requiring her to wake up at 4 AM to manage her daily activities, including caring for her special needs daughter. Her elbow is constantly swollen and feels heavy. She has not been on any medication since January, when she completed a two-month course of prednisone  and a month of methotrexate  but stopped due to lack of follow up. She was previously on 10 mg of prednisone , which helped manage her symptoms. Currently, she takes over-the-counter Tylenol  Arthritis, 650 mg, two tablets in the morning and two at night, but it does not alleviate her pain.  Her pain is severe, impacting her ability to perform daily tasks, such as cooking, where she wears a brace to manage the pain while chopping vegetables. She also reports feeling little lumps in her skin and describes her hand as warm and swollen.  She has a history of smoking, which may exacerbate her rheumatoid arthritis symptoms. Her daughter has advised her to reduce smoking, particularly marijuana, as it may worsen her condition.Her daughter has been helping her with her daily activities due to her limited mobility.        Previous  HPI 03/04/23 [name] is a 48 y.o. female with a long-standing history of joint problems and chronic right ear postraumatic drainage, is here for evaluation of inflammatory arthritis. She reports a significant increase in symptoms over the past two years. They describe persistent joint swelling, pain, and loss of grip strength, which have been present for most of their life but have worsened since turning 45.  The patient's mother had mentioned that they were born with arthritis. However, the patient's symptoms have become more debilitating recently, affecting their ability to work and perform daily activities.  She had evaluation in internal medicine clinic including x-rays and lab tests these demonstrated erosive disease throughout the right wrist  and positive serology with CCP >250. She was started on prednisone  for joint inflammation with a good improvement. Despite taking prednisone , the patient continues to experience joint swelling and pain, but notes that the medication allows them to function and work, albeit with discomfort. Currently on 10 mg daily, decreasing to 5 makes symptoms to severe for her to work. The patient also reports needing to take ibuprofen  in addition to prednisone  to manage their pain, or has to take 4-6 when she was off the prednisone . They express concern about the long-term complications of steroid use. She does not experience palpable or painful lymph node swelling.  She does have a persistent nodule on the back of her neck that has been there for years. They have  a history of smoking but quit seven years ago.     Labs reviewed ANA neg RF 25.7 CCP >250   Imaging reviewed 09/13/22 Xray right wrist IMPRESSION: 1. Severe radiocarpal, capitate-scaphoid, and capitate-lunate joint space narrowing with diffuse bone-on-bone contact, subchondral sclerosis, and cystic changes. 2. Moderate distal radioulnar joint space narrowing.   Review of Systems   Constitutional:  Positive for fatigue.  HENT:  Positive for mouth sores and mouth dryness.   Eyes:  Negative for dryness.  Respiratory:  Positive for shortness of breath.   Cardiovascular:  Positive for palpitations. Negative for chest pain.  Gastrointestinal:  Negative for blood in stool, constipation and diarrhea.  Endocrine: Negative for increased urination.  Genitourinary:  Negative for involuntary urination.  Musculoskeletal:  Positive for joint pain, gait problem, joint pain, joint swelling, myalgias, muscle weakness, morning stiffness, muscle tenderness and myalgias.  Skin:  Positive for nodules/bumps. Negative for color change, rash, hair loss and sensitivity to sunlight.  Allergic/Immunologic: Negative for susceptible to infections.  Neurological:  Negative for dizziness and headaches.  Hematological:  Negative for swollen glands.  Psychiatric/Behavioral:  Positive for sleep disturbance. Negative for depressed mood. The patient is not nervous/anxious.     PMFS History:  Patient Active Problem List   Diagnosis Date Noted   Abdominal pain 01/25/2023   Healthcare maintenance 01/05/2023   Rheumatoid arthritis with rheumatoid factor of multiple sites without organ or systems involvement (HCC) 01/05/2023   Ear discharge 01/05/2023   Essential hypertension 09/17/2022   Corpus luteum cyst 01/30/2012    Past Medical History:  Diagnosis Date   Hypertension    Rheumatoid arthritis (HCC)    Vaginal Pap smear, abnormal    LEEP, ok since    Family History  Problem Relation Age of Onset   Diabetes Mother    Hypertension Mother    Lupus Sister    Past Surgical History:  Procedure Laterality Date   CESAREAN SECTION N/A 01/31/2016   Procedure: CESAREAN SECTION;  Surgeon: Artemisa Bile, MD;  Location: [facility];  Service: Obstetrics;  Laterality: N/A;   CHOLECYSTECTOMY     DILATION AND CURETTAGE OF UTERUS     THERAPEUTIC ABORTION     Social History   Social History  Narrative   Not on file   Immunization History  Administered Date(s) Administered   Influenza, Seasonal, Injecte, Preservative Fre 01/05/2023   Influenza,inj,Quad PF,6+ Mos 01/27/2016   Tdap 01/31/2016     Objective: Vital Signs: BP (!) 155/92 (BP Location: Left Arm, Patient Position: Sitting, Cuff Size: Normal)   Pulse 76   Resp 16   Ht 5\' 6"  (1.676 m)   Wt 189 lb (85.7 kg)   BMI 30.51 kg/m    Physical Exam Eyes:     Conjunctiva/sclera: Conjunctivae normal.  Cardiovascular:     Rate and Rhythm: Normal rate and regular rhythm.  Pulmonary:     Effort: Pulmonary effort is normal.     Breath sounds: Normal breath sounds.  Musculoskeletal:     Right lower leg: No edema.     Left lower leg: No edema.  Lymphadenopathy:     Cervical: No cervical adenopathy.  Skin:    General: Skin is warm and dry.  Neurological:     Mental Status: She is alert.  Psychiatric:        Mood and Affect: Mood normal.      Musculoskeletal Exam:  Shoulders full ROM no tenderness or swelling Bilateral elbow tenderness to pressure, full range of  motion, mild swelling present at left elbow Right wrist severely limited flexion and extension range of motion with pain, left wrist range of motion normal Fingers full ROM no tenderness or swelling Knees full ROM no tenderness or swelling  Limited MSKUS exam of left elbow showing synovial thickening, effusion, and color doppler enhancement  Investigation: No additional findings.  Imaging: No results found.  Recent Labs: Lab Results  Component Value Date   WBC 7.9 03/04/2023   HGB 12.6 03/04/2023   PLT 273 03/04/2023   NA 139 03/04/2023   K 3.6 03/04/2023   CL 104 03/04/2023   CO2 29 03/04/2023   GLUCOSE 74 03/04/2023   BUN 8 03/04/2023   CREATININE 0.74 03/04/2023   BILITOT 0.2 03/04/2023   ALKPHOS 85 01/05/2023   AST 15 03/04/2023   ALT 15 03/04/2023   PROT 6.7 03/04/2023   ALBUMIN 4.0 01/05/2023   CALCIUM  9.2 03/04/2023   GFRAA  >60 05/19/2017   QFTBGOLDPLUS NEGATIVE 03/04/2023    Speciality Comments: No specialty comments available.  Procedures:  No procedures performed Allergies: Tea   Assessment / Plan:     Visit Diagnoses: Rheumatoid arthritis with rheumatoid factor of multiple sites without organ or systems involvement (HCC) - Plan: Sedimentation rate, C-reactive protein, methotrexate  (RHEUMATREX) 2.5 MG tablet, predniSONE  (DELTASONE ) 5 MG tablet Chronic active rheumatoid arthritis with significant joint inflammation and damage, confirmed by ultrasound and CCP antibody positivity. Methotrexate  treatment interrupted due to lack of follow-up labs. Smoking may exacerbate disease. - Order blood tests to recheck inflammation markers today. - Restart methotrexate  15 mg PO weekly and folic acid  1 mg daily - Prescribe prednisone  10 mg daily for one month, then reduce to 5 mg daily. - Advise on consistent methotrexate  dosing (6 tablets once weekly). - Educate on the association between smoking and increased rheumatoid arthritis activity, advising reduction or cessation. - Encourage light exercise and stretching to maintain joint function.   High risk medication use - Plan: CBC with Differential/Platelet, Comprehensive metabolic panel with GFR - Discuss potential side effects of methotrexate , including liver damage, and the need for regular monitoring. - Need repeat labs with CBC and CMP in ~3wks after methotrexate  start for medication monitoring  Vitamin D deficiency - Plan: VITAMIN D 25 Hydroxy (Vit-D Deficiency, Fractures) Checking vitamin D level for monitoring with increased risk of osteoporosis and fracture with inflammatory disease and prednisone  use.    Orders: Orders Placed This Encounter  Procedures   Sedimentation rate   CBC with Differential/Platelet   Comprehensive metabolic panel with GFR   VITAMIN D 25 Hydroxy (Vit-D Deficiency, Fractures)   C-reactive protein   Meds ordered this encounter   Medications   methotrexate  (RHEUMATREX) 2.5 MG tablet    Sig: Take 6 tablets (15 mg total) by mouth once a week. Caution:Chemotherapy. Protect from light.    Dispense:  30 tablet    Refill:  0   predniSONE  (DELTASONE ) 5 MG tablet    Sig: Take 2 tablets (10 mg total) by mouth daily with breakfast for 30 days, THEN 1 tablet (5 mg total) daily with breakfast.    Dispense:  60 tablet    Refill:  1     Follow-Up Instructions: Return in about 3 months (around 11/07/2023) for RA MTX start/GC f/u 3mos.   Matt Song, MD  Note - This record has been created using [employer].  Chart creation errors have been sought, but may not always  have been located. Such creation errors do not  reflect on  the standard of medical care.

## 2023-08-09 LAB — C-REACTIVE PROTEIN: CRP: 5.9 mg/L (ref <8.0)

## 2023-08-09 LAB — SEDIMENTATION RATE: Sed Rate: 11 mm/h (ref 0–20)

## 2023-08-09 LAB — VITAMIN D 25 HYDROXY (VIT D DEFICIENCY, FRACTURES): Vit D, 25-Hydroxy: 10 ng/mL — ABNORMAL LOW (ref 30–100)

## 2023-09-21 ENCOUNTER — Ambulatory Visit: Admitting: Pediatrics

## 2023-09-26 ENCOUNTER — Encounter: Payer: Self-pay | Admitting: *Deleted

## 2023-09-27 ENCOUNTER — Ambulatory Visit: Admitting: Internal Medicine

## 2023-10-03 NOTE — Progress Notes (Deleted)
 Office Visit Note  Patient: Jill Bright             Date of Birth: 1975/04/30           MRN: [id number]             PCP: Gabino Boga, MD Referring: Gabino Boga, MD Visit Date: 10/17/2023   Subjective:  No chief complaint on file.   History of Present Illness: Jill Bright is a 48 y.o. female here for follow up with seropositive rheumatoid arthritis who presents with persistent joint swelling and pain.    Previous HPI 08/08/2023 Jill Bright is a 48 year old female with seropositive rheumatoid arthritis who presents with persistent joint swelling and pain.   She has been experiencing persistent swelling and pain in her elbow for the past month. The swelling is constant, particularly in the morning, and she has difficulty with movement, sometimes being unable to roll over.   She experiences significant morning stiffness, requiring her to wake up at 4 AM to manage her daily activities, including caring for her special needs daughter. Her elbow is constantly swollen and feels heavy. She has not been on any medication since January, when she completed a two-month course of prednisone  and a month of methotrexate  but stopped due to lack of follow up. She was previously on 10 mg of prednisone , which helped manage her symptoms. Currently, she takes over-the-counter Tylenol  Arthritis, 650 mg, two tablets in the morning and two at night, but it does not alleviate her pain.   Her pain is severe, impacting her ability to perform daily tasks, such as cooking, where she wears a brace to manage the pain while chopping vegetables. She also reports feeling little lumps in her skin and describes her hand as warm and swollen.   She has a history of smoking, which may exacerbate her rheumatoid arthritis symptoms. Her daughter has advised her to reduce smoking, particularly marijuana, as it may worsen her condition.Her daughter has been helping her with her daily  activities due to her limited mobility.         Previous HPI 03/04/23 Jill Bright is a 48 y.o. female with a long-standing history of joint problems and chronic right ear postraumatic drainage, is here for evaluation of inflammatory arthritis. She reports a significant increase in symptoms over the past two years. They describe persistent joint swelling, pain, and loss of grip strength, which have been present for most of their life but have worsened since turning 45.  The patient's mother had mentioned that they were born with arthritis. However, the patient's symptoms have become more debilitating recently, affecting their ability to work and perform daily activities.  She had evaluation in internal medicine clinic including x-rays and lab tests these demonstrated erosive disease throughout the right wrist  and positive serology with CCP >250. She was started on prednisone  for joint inflammation with a good improvement. Despite taking prednisone , the patient continues to experience joint swelling and pain, but notes that the medication allows them to function and work, albeit with discomfort. Currently on 10 mg daily, decreasing to 5 makes symptoms to severe for her to work. The patient also reports needing to take ibuprofen  in addition to prednisone  to manage their pain, or has to take 4-6 when she was off the prednisone . They express concern about the long-term complications of steroid use. She does not experience palpable or painful lymph node swelling.  She does have a persistent nodule  on the back of her neck that has been there for years. They have a history of smoking but quit seven years ago.     Labs reviewed ANA neg RF 25.7 CCP >250   Imaging reviewed 09/13/22 Xray right wrist IMPRESSION: 1. Severe radiocarpal, capitate-scaphoid, and capitate-lunate joint space narrowing with diffuse bone-on-bone contact, subchondral sclerosis, and cystic changes. 2. Moderate distal radioulnar  joint space narrowing.   No Rheumatology ROS completed.   PMFS History:  Patient Active Problem List   Diagnosis Date Noted   Abdominal pain 01/25/2023   Healthcare maintenance 01/05/2023   Rheumatoid arthritis with rheumatoid factor of multiple sites without organ or systems involvement (HCC) 01/05/2023   Ear discharge 01/05/2023   Essential hypertension 09/17/2022   Corpus luteum cyst 01/30/2012    Past Medical History:  Diagnosis Date   Hypertension    Rheumatoid arthritis (HCC)    Vaginal Pap smear, abnormal    LEEP, ok since    Family History  Problem Relation Age of Onset   Diabetes Mother    Hypertension Mother    Lupus Sister    Past Surgical History:  Procedure Laterality Date   CESAREAN SECTION N/A 01/31/2016   Procedure: CESAREAN SECTION;  Surgeon: Gigi Botts, MD;  Location: [facility];  Service: Obstetrics;  Laterality: N/A;   CHOLECYSTECTOMY     DILATION AND CURETTAGE OF UTERUS     THERAPEUTIC ABORTION     Social History   Social History Narrative   Not on file   Immunization History  Administered Date(s) Administered   Influenza, Seasonal, Injecte, Preservative Fre 01/05/2023   Influenza,inj,Quad PF,6+ Mos 01/27/2016   Tdap 01/31/2016     Objective: Vital Signs: There were no vitals taken for this visit.   Physical Exam   Musculoskeletal Exam: ***  CDAI Exam: CDAI Score: -- Patient Global: --; Provider Global: -- Swollen: --; Tender: -- Joint Exam 10/17/2023   No joint exam has been documented for this visit   There is currently no information documented on the homunculus. Go to the Rheumatology activity and complete the homunculus joint exam.  Investigation: No additional findings.  Imaging: No results found.  Recent Labs: Lab Results  Component Value Date   WBC 7.9 03/04/2023   HGB 12.6 03/04/2023   PLT 273 03/04/2023   NA 139 03/04/2023   K 3.6 03/04/2023   CL 104 03/04/2023   CO2 29 03/04/2023   GLUCOSE 74  03/04/2023   BUN 8 03/04/2023   CREATININE 0.74 03/04/2023   BILITOT 0.2 03/04/2023   ALKPHOS 85 01/05/2023   AST 15 03/04/2023   ALT 15 03/04/2023   PROT 6.7 03/04/2023   ALBUMIN 4.0 01/05/2023   CALCIUM  9.2 03/04/2023   GFRAA >60 05/19/2017   QFTBGOLDPLUS NEGATIVE 03/04/2023    Speciality Comments: No specialty comments available.  Procedures:  No procedures performed Allergies: Tea   Assessment / Plan:     Visit Diagnoses: No diagnosis found.  ***  Orders: No orders of the defined types were placed in this encounter.  No orders of the defined types were placed in this encounter.    Follow-Up Instructions: No follow-ups on file.   Shelba SHAUNNA Potters, RT  Note - This record has been created using [employer].  Chart creation errors have been sought, but may not always  have been located. Such creation errors do not reflect on  the standard of medical care.

## 2023-10-12 ENCOUNTER — Other Ambulatory Visit: Payer: Self-pay

## 2023-10-12 ENCOUNTER — Other Ambulatory Visit: Payer: Self-pay | Admitting: Internal Medicine

## 2023-10-12 DIAGNOSIS — Z1231 Encounter for screening mammogram for malignant neoplasm of breast: Secondary | ICD-10-CM

## 2023-10-17 ENCOUNTER — Ambulatory Visit: Admitting: Internal Medicine

## 2023-10-17 DIAGNOSIS — M0579 Rheumatoid arthritis with rheumatoid factor of multiple sites without organ or systems involvement: Secondary | ICD-10-CM

## 2023-10-17 DIAGNOSIS — Z7952 Long term (current) use of systemic steroids: Secondary | ICD-10-CM

## 2023-10-17 DIAGNOSIS — E559 Vitamin D deficiency, unspecified: Secondary | ICD-10-CM

## 2023-10-17 DIAGNOSIS — Z79899 Other long term (current) drug therapy: Secondary | ICD-10-CM

## 2023-10-18 ENCOUNTER — Encounter

## 2023-10-18 DIAGNOSIS — Z1231 Encounter for screening mammogram for malignant neoplasm of breast: Secondary | ICD-10-CM

## 2023-11-01 NOTE — Progress Notes (Deleted)
 Office Visit Note  Patient: Jill Bright             Date of Birth: April 09, 1976           MRN: [id number]             PCP: Benuel Braun, DO Referring: Gabino Boga, MD Visit Date: 11/08/2023   Subjective:  No chief complaint on file.   History of Present Illness: Jill Bright is a 48 y.o. female here for follow up with seropositive rheumatoid arthritis who presents with persistent joint swelling and pain.    Previous HPI 08/08/2023 Jill Bright is a 48 year old female with seropositive rheumatoid arthritis who presents with persistent joint swelling and pain.   She has been experiencing persistent swelling and pain in her elbow for the past month. The swelling is constant, particularly in the morning, and she has difficulty with movement, sometimes being unable to roll over.   She experiences significant morning stiffness, requiring her to wake up at 4 AM to manage her daily activities, including caring for her special needs daughter. Her elbow is constantly swollen and feels heavy. She has not been on any medication since January, when she completed a two-month course of prednisone  and a month of methotrexate  but stopped due to lack of follow up. She was previously on 10 mg of prednisone , which helped manage her symptoms. Currently, she takes over-the-counter Tylenol  Arthritis, 650 mg, two tablets in the morning and two at night, but it does not alleviate her pain.   Her pain is severe, impacting her ability to perform daily tasks, such as cooking, where she wears a brace to manage the pain while chopping vegetables. She also reports feeling little lumps in her skin and describes her hand as warm and swollen.   She has a history of smoking, which may exacerbate her rheumatoid arthritis symptoms. Her daughter has advised her to reduce smoking, particularly marijuana, as it may worsen her condition.Her daughter has been helping her with her daily activities  due to her limited mobility.         Previous HPI 03/04/23 Jill Bright is a 48 y.o. female with a long-standing history of joint problems and chronic right ear postraumatic drainage, is here for evaluation of inflammatory arthritis. She reports a significant increase in symptoms over the past two years. They describe persistent joint swelling, pain, and loss of grip strength, which have been present for most of their life but have worsened since turning 45.  The patient's mother had mentioned that they were born with arthritis. However, the patient's symptoms have become more debilitating recently, affecting their ability to work and perform daily activities.  She had evaluation in internal medicine clinic including x-rays and lab tests these demonstrated erosive disease throughout the right wrist  and positive serology with CCP >250. She was started on prednisone  for joint inflammation with a good improvement. Despite taking prednisone , the patient continues to experience joint swelling and pain, but notes that the medication allows them to function and work, albeit with discomfort. Currently on 10 mg daily, decreasing to 5 makes symptoms to severe for her to work. The patient also reports needing to take ibuprofen  in addition to prednisone  to manage their pain, or has to take 4-6 when she was off the prednisone . They express concern about the long-term complications of steroid use. She does not experience palpable or painful lymph node swelling.  She does have a persistent nodule  on the back of her neck that has been there for years. They have a history of smoking but quit seven years ago.     Labs reviewed ANA neg RF 25.7 CCP >250   Imaging reviewed 09/13/22 Xray right wrist IMPRESSION: 1. Severe radiocarpal, capitate-scaphoid, and capitate-lunate joint space narrowing with diffuse bone-on-bone contact, subchondral sclerosis, and cystic changes. 2. Moderate distal radioulnar joint space  narrowing.   No Rheumatology ROS completed.   PMFS History:  Patient Active Problem List   Diagnosis Date Noted   Abdominal pain 01/25/2023   Healthcare maintenance 01/05/2023   Rheumatoid arthritis with rheumatoid factor of multiple sites without organ or systems involvement (HCC) 01/05/2023   Ear discharge 01/05/2023   Essential hypertension 09/17/2022   Corpus luteum cyst 01/30/2012    Past Medical History:  Diagnosis Date   Hypertension    Rheumatoid arthritis (HCC)    Vaginal Pap smear, abnormal    LEEP, ok since    Family History  Problem Relation Age of Onset   Diabetes Mother    Hypertension Mother    Lupus Sister    Past Surgical History:  Procedure Laterality Date   CESAREAN SECTION N/A 01/31/2016   Procedure: CESAREAN SECTION;  Surgeon: Gigi Botts, MD;  Location: [facility];  Service: Obstetrics;  Laterality: N/A;   CHOLECYSTECTOMY     DILATION AND CURETTAGE OF UTERUS     THERAPEUTIC ABORTION     Social History   Social History Narrative   Not on file   Immunization History  Administered Date(s) Administered   Influenza, Seasonal, Injecte, Preservative Fre 01/05/2023   Influenza,inj,Quad PF,6+ Mos 01/27/2016   Tdap 01/31/2016     Objective: Vital Signs: There were no vitals taken for this visit.   Physical Exam   Musculoskeletal Exam: ***  CDAI Exam: CDAI Score: -- Patient Global: --; Provider Global: -- Swollen: --; Tender: -- Joint Exam 11/08/2023   No joint exam has been documented for this visit   There is currently no information documented on the homunculus. Go to the Rheumatology activity and complete the homunculus joint exam.  Investigation: No additional findings.  Imaging: No results found.  Recent Labs: Lab Results  Component Value Date   WBC 7.9 03/04/2023   HGB 12.6 03/04/2023   PLT 273 03/04/2023   NA 139 03/04/2023   K 3.6 03/04/2023   CL 104 03/04/2023   CO2 29 03/04/2023   GLUCOSE 74 03/04/2023    BUN 8 03/04/2023   CREATININE 0.74 03/04/2023   BILITOT 0.2 03/04/2023   ALKPHOS 85 01/05/2023   AST 15 03/04/2023   ALT 15 03/04/2023   PROT 6.7 03/04/2023   ALBUMIN 4.0 01/05/2023   CALCIUM  9.2 03/04/2023   GFRAA >60 05/19/2017   QFTBGOLDPLUS NEGATIVE 03/04/2023    Speciality Comments: No specialty comments available.  Procedures:  No procedures performed Allergies: Tea   Assessment / Plan:     Visit Diagnoses: No diagnosis found.  ***  Orders: No orders of the defined types were placed in this encounter.  No orders of the defined types were placed in this encounter.    Follow-Up Instructions: No follow-ups on file.   Shelba SHAUNNA Potters, RT  Note - This record has been created using [employer].  Chart creation errors have been sought, but may not always  have been located. Such creation errors do not reflect on  the standard of medical care.

## 2023-11-08 ENCOUNTER — Ambulatory Visit: Admitting: Internal Medicine

## 2023-11-08 DIAGNOSIS — E559 Vitamin D deficiency, unspecified: Secondary | ICD-10-CM

## 2023-11-08 DIAGNOSIS — M0579 Rheumatoid arthritis with rheumatoid factor of multiple sites without organ or systems involvement: Secondary | ICD-10-CM

## 2023-11-08 DIAGNOSIS — Z79899 Other long term (current) drug therapy: Secondary | ICD-10-CM

## 2023-11-08 DIAGNOSIS — Z7952 Long term (current) use of systemic steroids: Secondary | ICD-10-CM

## 2023-11-09 ENCOUNTER — Ambulatory Visit: Admitting: Internal Medicine

## 2023-11-22 DIAGNOSIS — H5213 Myopia, bilateral: Secondary | ICD-10-CM | POA: Diagnosis not present

## 2023-11-29 ENCOUNTER — Ambulatory Visit

## 2023-12-01 NOTE — Progress Notes (Signed)
 Office Visit Note  Patient: Jill Bright             Date of Birth: 29-Aug-1975           MRN: [id number]             PCP: Benuel Braun, DO Referring: Benuel Braun, DO Visit Date: 12/02/2023   Subjective:  Follow-up (Patient states she has constant pain always. Patient states she has a lot of pain in both of her arms and she gets no sleep. Patient states she has knots popping up. Patient states her right wrist is hard to bend. )    Discussed the use of AI scribe software for clinical note transcription with the patient, who gave verbal consent to proceed.  History of Present Illness   Jill Bright is a 48 year old female with seropositive rheumatoid arthritis who presents with worsening joint pain and swelling.  Since her last visit in April, she has experienced worsening symptoms of rheumatoid arthritis, with constant pain and swelling in her left elbow, which is accompanied by lumps. She describes sharp pains in her wrist and an inability to bend it, stating it feels like 'always a knot right here'.  Her right wrist has very limited mobility and she is unable to bend it, with constant pain. She experiences constant pain and has difficulty with daily activities due to the limited movement in her elbow and wrist.  She was previously prescribed methotrexate  and prednisone . Methotrexate  was not taken consistently due to personal circumstances and lack of follow up, but when tried in June, it did not cause nausea or diarrhea. Prednisone  provided some pain relief, but she still required additional pain management with Advil .   Previous HPI 08/08/23 Jill Bright is a 48 year old female with seropositive rheumatoid arthritis who presents with persistent joint swelling and pain.   She has been experiencing persistent swelling and pain in her elbow for the past month. The swelling is constant, particularly in the morning, and she has difficulty with  movement, sometimes being unable to roll over.   She experiences significant morning stiffness, requiring her to wake up at 4 AM to manage her daily activities, including caring for her special needs daughter. Her elbow is constantly swollen and feels heavy. She has not been on any medication since January, when she completed a two-month course of prednisone  and a month of methotrexate  but stopped due to lack of follow up. She was previously on 10 mg of prednisone , which helped manage her symptoms. Currently, she takes over-the-counter Tylenol  Arthritis, 650 mg, two tablets in the morning and two at night, but it does not alleviate her pain.   Her pain is severe, impacting her ability to perform daily tasks, such as cooking, where she wears a brace to manage the pain while chopping vegetables. She also reports feeling little lumps in her skin and describes her hand as warm and swollen.   She has a history of smoking, which may exacerbate her rheumatoid arthritis symptoms. Her daughter has advised her to reduce smoking, particularly marijuana, as it may worsen her condition.Her daughter has been helping her with her daily activities due to her limited mobility.         Previous HPI 03/04/23 Jill Bright is a 48 y.o. female with a long-standing history of joint problems and chronic right ear postraumatic drainage, is here for evaluation of inflammatory arthritis. She reports a significant increase in symptoms over  the past two years. They describe persistent joint swelling, pain, and loss of grip strength, which have been present for most of their life but have worsened since turning 45.  The patient's mother had mentioned that they were born with arthritis. However, the patient's symptoms have become more debilitating recently, affecting their ability to work and perform daily activities.  She had evaluation in internal medicine clinic including x-rays and lab tests these demonstrated erosive  disease throughout the right wrist  and positive serology with CCP >250. She was started on prednisone  for joint inflammation with a good improvement. Despite taking prednisone , the patient continues to experience joint swelling and pain, but notes that the medication allows them to function and work, albeit with discomfort. Currently on 10 mg daily, decreasing to 5 makes symptoms to severe for her to work. The patient also reports needing to take ibuprofen  in addition to prednisone  to manage their pain, or has to take 4-6 when she was off the prednisone . They express concern about the long-term complications of steroid use. She does not experience palpable or painful lymph node swelling.  She does have a persistent nodule on the back of her neck that has been there for years. They have a history of smoking but quit seven years ago.     Labs reviewed ANA neg RF 25.7 CCP >250   Imaging reviewed 09/13/22 Xray right wrist IMPRESSION: 1. Severe radiocarpal, capitate-scaphoid, and capitate-lunate joint space narrowing with diffuse bone-on-bone contact, subchondral sclerosis, and cystic changes. 2. Moderate distal radioulnar joint space narrowing.   Review of Systems  Constitutional:  Positive for fatigue.  HENT:  Negative for mouth sores and mouth dryness.   Eyes:  Negative for dryness.  Respiratory:  Positive for shortness of breath.   Cardiovascular:  Negative for chest pain and palpitations.  Gastrointestinal:  Negative for blood in stool, constipation and diarrhea.  Endocrine: Negative for increased urination.  Genitourinary:  Positive for involuntary urination.  Musculoskeletal:  Positive for joint pain, joint pain, joint swelling, myalgias, muscle weakness, morning stiffness, muscle tenderness and myalgias. Negative for gait problem.  Skin:  Positive for color change. Negative for rash, hair loss and sensitivity to sunlight.  Allergic/Immunologic: Negative for susceptible to infections.   Neurological:  Positive for dizziness. Negative for headaches.  Hematological:  Positive for swollen glands.  Psychiatric/Behavioral:  Positive for depressed mood and sleep disturbance. The patient is nervous/anxious.     PMFS History:  Patient Active Problem List   Diagnosis Date Noted   Abdominal pain 01/25/2023   Healthcare maintenance 01/05/2023   Rheumatoid arthritis with rheumatoid factor of multiple sites without organ or systems involvement (HCC) 01/05/2023   Ear discharge 01/05/2023   Essential hypertension 09/17/2022   Corpus luteum cyst 01/30/2012    Past Medical History:  Diagnosis Date   Hypertension    Rheumatoid arthritis (HCC)    Vaginal Pap smear, abnormal    LEEP, ok since    Family History  Problem Relation Age of Onset   Diabetes Mother    Hypertension Mother    Lupus Sister    Past Surgical History:  Procedure Laterality Date   CESAREAN SECTION N/A 01/31/2016   Procedure: CESAREAN SECTION;  Surgeon: Gigi Botts, MD;  Location: [facility];  Service: Obstetrics;  Laterality: N/A;   CHOLECYSTECTOMY     DILATION AND CURETTAGE OF UTERUS     THERAPEUTIC ABORTION     Social History   Social History Narrative   Not on file  Immunization History  Administered Date(s) Administered   Influenza, Seasonal, Injecte, Preservative Fre 01/05/2023   Influenza,inj,Quad PF,6+ Mos 01/27/2016   Tdap 01/31/2016     Objective: Vital Signs: BP (!) 162/100 (BP Location: Left Arm, Patient Position: Sitting, Cuff Size: Normal)   Pulse 70   Resp 14   Ht 5' 6 (1.676 m)   Wt 193 lb (87.5 kg)   LMP 11/30/2023   BMI 31.15 kg/m    Physical Exam Eyes:     Conjunctiva/sclera: Conjunctivae normal.  Cardiovascular:     Rate and Rhythm: Normal rate and regular rhythm.  Pulmonary:     Effort: Pulmonary effort is normal.     Breath sounds: Normal breath sounds.  Skin:    General: Skin is warm and dry.     Comments: Nontender nodules on elbow and forearm  extensor surface  Neurological:     Mental Status: She is alert.  Psychiatric:        Mood and Affect: Mood normal.      Musculoskeletal Exam:  Shoulders full ROM no tenderness or swelling Bilateral elbow tenderness to pressure, swelling, right elbow extension limited at about 160 degrees Right wrist severely limited flexion and extension range of motion with pain, left wrist range of motion normal Fingers full ROM no tenderness or swelling Knees full ROM no tenderness or swelling   Investigation: No additional findings.  Imaging: No results found.  Recent Labs: Lab Results  Component Value Date   WBC 8.5 12/02/2023   HGB 11.9 12/02/2023   PLT 299 12/02/2023   NA 136 12/02/2023   K 4.0 12/02/2023   CL 106 12/02/2023   CO2 23 12/02/2023   GLUCOSE 77 12/02/2023   BUN 7 12/02/2023   CREATININE 0.72 12/02/2023   BILITOT 0.2 12/02/2023   ALKPHOS 85 01/05/2023   AST 14 12/02/2023   ALT 11 12/02/2023   PROT 7.0 12/02/2023   ALBUMIN 4.0 01/05/2023   CALCIUM  9.1 12/02/2023   GFRAA >60 05/19/2017   QFTBGOLDPLUS NEGATIVE 03/04/2023    Speciality Comments: No specialty comments available.  Procedures:  No procedures performed Allergies: Tea   Assessment / Plan:     Visit Diagnoses: Rheumatoid arthritis with rheumatoid factor of multiple sites without organ or systems involvement (HCC) - Plan: methotrexate  (RHEUMATREX) 2.5 MG tablet, predniSONE  (DELTASONE ) 10 MG tablet, Sedimentation rate Rheumatoid arthritis with joint deformity and nodules, causing pain, swelling, and limited mobility in left elbow and right wrist. Right wrist shows bone-on-bone contact, likely leading to fusion. Left elbow limited to 160 degrees extension. Rheumatoid nodules developing. Inconsistent methotrexate  and prednisone  use likely contributed to symptom progression. Methotrexate  tolerated well, prednisone  provided some relief. - Checking sed rate for disease activity assessment - Restart  methotrexate  15 mg PO weekly and folic acid  1 mg daily - Resume prednisone  20 mg for 1 week then 10 mg daily  High risk medication use - Plan: CBC with Differential/Platelet, Comprehensive metabolic panel with GFR Reports tolerating methotrexate  without any particular complication or side effect over limited duration of treatment. - Rechecking CBC and CMP medication monitoring for methotrexate   Vitamin D  deficiency - Plan: Vitamin D , Ergocalciferol , (DRISDOL ) 1.25 MG (50000 UNIT) CAPS capsule Started on high-dose vitamin D  supplementation at her previous value deficient at 10 in April, plan for 12 weeks followed by recommendation for long-term daily supplementation.    Orders: Orders Placed This Encounter  Procedures   Sedimentation rate   CBC with Differential/Platelet   Comprehensive metabolic panel with GFR  Meds ordered this encounter  Medications   methotrexate  (RHEUMATREX) 2.5 MG tablet    Sig: Take 6 tablets (15 mg total) by mouth once a week. Caution:Chemotherapy. Protect from light.    Dispense:  30 tablet    Refill:  1   predniSONE  (DELTASONE ) 10 MG tablet    Sig: Take 2 tablets (20 mg total) by mouth daily with breakfast for 7 days, THEN 1 tablet (10 mg total) daily with breakfast.    Dispense:  44 tablet    Refill:  1   Vitamin D , Ergocalciferol , (DRISDOL ) 1.25 MG (50000 UNIT) CAPS capsule    Sig: Take 1 capsule (50,000 Units total) by mouth every 7 (seven) days.    Dispense:  12 capsule    Refill:  0     Follow-Up Instructions: Return in about 6 weeks (around 01/13/2024) for RA MTX/GC start f/u 1-81mos.   Lonni LELON Ester, MD  Note - This record has been created using [employer].  Chart creation errors have been sought, but may not always  have been located. Such creation errors do not reflect on  the standard of medical care.

## 2023-12-02 ENCOUNTER — Encounter: Payer: Self-pay | Admitting: Internal Medicine

## 2023-12-02 ENCOUNTER — Encounter: Payer: Self-pay | Admitting: *Deleted

## 2023-12-02 ENCOUNTER — Ambulatory Visit: Attending: Internal Medicine | Admitting: Internal Medicine

## 2023-12-02 VITALS — BP 162/100 | HR 70 | Resp 14 | Ht 66.0 in | Wt 193.0 lb

## 2023-12-02 DIAGNOSIS — M0579 Rheumatoid arthritis with rheumatoid factor of multiple sites without organ or systems involvement: Secondary | ICD-10-CM | POA: Insufficient documentation

## 2023-12-02 DIAGNOSIS — E559 Vitamin D deficiency, unspecified: Secondary | ICD-10-CM | POA: Insufficient documentation

## 2023-12-02 DIAGNOSIS — Z79899 Other long term (current) drug therapy: Secondary | ICD-10-CM | POA: Diagnosis not present

## 2023-12-02 MED ORDER — PREDNISONE 10 MG PO TABS: ORAL_TABLET | ORAL | 1 refills | Status: AC

## 2023-12-02 MED ORDER — METHOTREXATE SODIUM 2.5 MG PO TABS: 15.0000 mg | ORAL_TABLET | ORAL | 1 refills | Status: DC

## 2023-12-02 MED ORDER — VITAMIN D (ERGOCALCIFEROL) 1.25 MG (50000 UNIT) PO CAPS: 50000.0000 [IU] | ORAL_CAPSULE | ORAL | 0 refills | Status: AC

## 2023-12-07 ENCOUNTER — Encounter: Admitting: Obstetrics and Gynecology

## 2023-12-07 LAB — COMPREHENSIVE METABOLIC PANEL WITH GFR
AG Ratio: 1.4 (calc) (ref 1.0–2.5)
ALT: 11 U/L (ref 6–29)
AST: 14 U/L (ref 10–35)
Albumin: 4.1 g/dL (ref 3.6–5.1)
Alkaline phosphatase (APISO): 71 U/L (ref 31–125)
BUN: 7 mg/dL (ref 7–25)
CO2: 23 mmol/L (ref 20–32)
Calcium: 9.1 mg/dL (ref 8.6–10.2)
Chloride: 106 mmol/L (ref 98–110)
Creat: 0.72 mg/dL (ref 0.50–0.99)
Globulin: 2.9 g/dL (ref 1.9–3.7)
Glucose, Bld: 77 mg/dL (ref 65–99)
Potassium: 4 mmol/L (ref 3.5–5.3)
Sodium: 136 mmol/L (ref 135–146)
Total Bilirubin: 0.2 mg/dL (ref 0.2–1.2)
Total Protein: 7 g/dL (ref 6.1–8.1)
eGFR: 103 mL/min/1.73m2 (ref >=60)

## 2023-12-07 LAB — CBC WITH DIFFERENTIAL/PLATELET
Absolute Lymphocytes: 2270 {cells}/uL (ref 850–3900)
Absolute Monocytes: 570 {cells}/uL (ref 200–950)
Basophils Absolute: 34 {cells}/uL (ref 0–200)
Basophils Relative: 0.4 %
Eosinophils Absolute: 34 {cells}/uL (ref 15–500)
Eosinophils Relative: 0.4 %
HCT: 38.1 % (ref 35.0–45.0)
Hemoglobin: 11.9 g/dL (ref 11.7–15.5)
MCH: 28.1 pg (ref 27.0–33.0)
MCHC: 31.2 g/dL — ABNORMAL LOW (ref 32.0–36.0)
MCV: 89.9 fL (ref 80.0–100.0)
MPV: 12.2 fL (ref 7.5–12.5)
Monocytes Relative: 6.7 %
Neutro Abs: 5593 {cells}/uL (ref 1500–7800)
Neutrophils Relative %: 65.8 %
Platelets: 299 Thousand/uL (ref 140–400)
RBC: 4.24 Million/uL (ref 3.80–5.10)
RDW: 12 % (ref 11.0–15.0)
Total Lymphocyte: 26.7 %
WBC: 8.5 Thousand/uL (ref 3.8–10.8)

## 2023-12-07 LAB — SEDIMENTATION RATE: Sed Rate: 28 mm/h — ABNORMAL HIGH (ref 0–20)

## 2023-12-20 ENCOUNTER — Ambulatory Visit: Payer: Self-pay | Admitting: Student

## 2023-12-21 ENCOUNTER — Ambulatory Visit: Payer: Self-pay | Admitting: Student

## 2023-12-21 ENCOUNTER — Encounter: Payer: Self-pay | Admitting: Student

## 2023-12-21 ENCOUNTER — Ambulatory Visit: Admitting: Student

## 2023-12-21 VITALS — BP 181/111 | HR 67 | Temp 98.0°F | Ht 66.0 in | Wt 190.4 lb

## 2023-12-21 DIAGNOSIS — R3589 Other polyuria: Secondary | ICD-10-CM

## 2023-12-21 DIAGNOSIS — N3946 Mixed incontinence: Secondary | ICD-10-CM

## 2023-12-21 DIAGNOSIS — I10 Essential (primary) hypertension: Secondary | ICD-10-CM

## 2023-12-21 DIAGNOSIS — N3090 Cystitis, unspecified without hematuria: Secondary | ICD-10-CM

## 2023-12-21 LAB — POCT GLYCOSYLATED HEMOGLOBIN (HGB A1C): Hemoglobin A1C: 5.5 % (ref 4.0–5.6)

## 2023-12-21 LAB — GLUCOSE, CAPILLARY: Glucose-Capillary: 153 mg/dL — ABNORMAL HIGH (ref 70–99)

## 2023-12-21 MED ORDER — AMLODIPINE BESYLATE 10 MG PO TABS: 10.0000 mg | ORAL_TABLET | Freq: Every day | ORAL | 11 refills | Status: AC

## 2023-12-21 NOTE — Assessment & Plan Note (Addendum)
 Vitals:   12/21/23 0831 12/21/23 0853  BP: (!) 171/98 (!) 181/111    Elevated during this visit.  Previously on amlodipine  5 mg which she has not continued taking.  We did not have time to discuss this at length; however, patient denies headaches, changes in vision, chest pain or shortness of breath.  Discussed the need for tight blood pressure control.  Will restart amlodipine  at 10 mg daily and plan return to clinic within 1 month for blood pressure recheck and likely medication changes with BMP. - Amlodipine  10 mg daily

## 2023-12-21 NOTE — Progress Notes (Signed)
 Subjective:  CC: Incontinence  HPI:  [name] is a 48 y.o. female with a past medical history stated below and presents today for bladder incontinence. Please see problem based assessment and plan for additional details.  Past Medical History:  Diagnosis Date   Hypertension    Rheumatoid arthritis (HCC)    Vaginal Pap smear, abnormal    LEEP, ok since    Current Outpatient Medications on File Prior to Visit  Medication Sig Dispense Refill   Acetaminophen  (TYLENOL  PO) Take by mouth.     Bismuth /Metronidaz/Tetracyclin 140-125-125 MG CAPS Take 3 capsules by mouth in the morning, at noon, in the evening, and at bedtime for 10 days. (Patient not taking: Reported on 12/02/2023) 120 capsule 0   calcium  carbonate (TUMS - DOSED IN MG ELEMENTAL CALCIUM ) 500 MG chewable tablet Chew 2 tablets by mouth 3 (three) times daily as needed for indigestion or heartburn. (Patient not taking: Reported on 12/02/2023)     folic acid  (FOLVITE ) 1 MG tablet Take 1 tablet (1 mg total) by mouth daily. 90 tablet 1   IBUPROFEN  PO Take by mouth.     methotrexate  (RHEUMATREX) 2.5 MG tablet Take 6 tablets (15 mg total) by mouth once a week. Caution:Chemotherapy. Protect from light. 30 tablet 1   naproxen  (NAPROSYN ) 500 MG tablet Take 1 tablet (500 mg total) by mouth 2 (two) times daily. (Patient not taking: Reported on 12/02/2023) 30 tablet 0   pantoprazole  (PROTONIX ) 40 MG tablet Take 1 tablet (40 mg total) by mouth 2 (two) times daily. (Patient not taking: Reported on 12/02/2023) 90 tablet 3   predniSONE  (DELTASONE ) 10 MG tablet Take 2 tablets (20 mg total) by mouth daily with breakfast for 7 days, THEN 1 tablet (10 mg total) daily with breakfast. 44 tablet 1   Vitamin D , Ergocalciferol , (DRISDOL ) 1.25 MG (50000 UNIT) CAPS capsule Take 1 capsule (50,000 Units total) by mouth every 7 (seven) days. 12 capsule 0   No current facility-administered medications on file prior to visit.    Family History   Problem Relation Age of Onset   Diabetes Mother    Hypertension Mother    Lupus Sister     Social History   Socioeconomic History   Marital status: Single    Spouse name: Not on file   Number of children: Not on file   Years of education: Not on file   Highest education level: Not on file  Occupational History   Not on file  Tobacco Use   Smoking status: Former    Current packs/day: 0.25    Average packs/day: 0.3 packs/day for 4.0 years (1.0 ttl pk-yrs)    Types: Cigarettes    Passive exposure: Past   Smokeless tobacco: Never   Tobacco comments:    cutting back 2/day for the past year  Vaping Use   Vaping status: Never Used  Substance and Sexual Activity   Alcohol use: No   Drug use: Not Currently    Types: Marijuana    Comment: prior to +preg   Sexual activity: Yes    Birth control/protection: Condom  Other Topics Concern   Not on file  Social History Narrative   Not on file   Social Drivers of Health   Financial Resource Strain: Not on file  Food Insecurity: Not on file  Transportation Needs: Not on file  Physical Activity: Not on file  Stress: Not on file  Social Connections: Not on file  Intimate Partner Violence: Not  on file    Review of Systems: ROS negative except for what is noted on the assessment and plan.  Objective:   Vitals:   12/21/23 0831 12/21/23 0853  BP: (!) 171/98 (!) 181/111  Pulse: 70 67  Temp: 98 F (36.7 C)   TempSrc: Oral   SpO2: 99%   Weight: 190 lb 6.4 oz (86.4 kg)   Height: 5' 6 (1.676 m)     Physical Exam: Constitutional: well-appearing woman laying in exam bed, in no acute distress HENT: normocephalic atraumatic, mucous membranes moist Eyes: conjunctiva non-erythematous Neck: supple Cardiovascular: regular rate and rhythm, no m/r/g Pulmonary/Chest: normal work of breathing on room air, lungs clear to auscultation bilaterally Abdominal: soft, non-tender, non-distended, no suprapubic tenderness, no CVA  tenderness MSK: normal bulk and tone Neurological: alert & oriented x 3 normal gait Skin: warm and dry Psych: Pleasant mood and affect   Assessment & Plan:   Mixed incontinence urge and stress Patient reports a 4-year history of urinary leakage whenever she coughs,sneezes, or lifts heavy things.  This started about 4 years ago with after the birth of her fourth kid.  Initially this is all that happened but progressively,.  Symptoms worsened to also include sudden, intense urge to urinate but that this is less common than the urine loss with increase abdominal pressure.  Not temporality between the days symptoms and food consumption, no abdominal fullness, focal neurological deficits, including saddle anesthesia, or inability to get to the toilet.  No systemic or localized symptoms of infection or irritation.  Given the chronicity and risk factors for this patient, suspect of chronic disease processes like mixed incontinence, urge and stress.  She also reports some polydipsia; as such, will screen for diabetes given metabolic risk factors and use of prednisone .  -  Lab Results  Component Value Date   HGBA1C 5.5 12/21/2023  Within normal limits  -Discuss behavioral therapy and referral to OB/GYN for management of these and referral to pelvic floor PT   Essential hypertension Vitals:   12/21/23 0831 12/21/23 0853  BP: (!) 171/98 (!) 181/111    Elevated during this visit.  Previously on amlodipine  5 mg which she has not continued taking.  We did not have time to discuss this at length; however, patient denies headaches, changes in vision, chest pain or shortness of breath.  Discussed the need for tight blood pressure control.  Will restart amlodipine  at 10 mg daily and plan return to clinic within 1 month for blood pressure recheck and likely medication changes with BMP. - Amlodipine  10 mg daily   Return in about 2 weeks (around 01/04/2024) for 2-4 weeeks for hypertension.  Patient  discussed with Dr. Trudy Ip [name], MD [facility]

## 2023-12-21 NOTE — Assessment & Plan Note (Signed)
 Patient reports a 4-year history of urinary leakage whenever she coughs,sneezes, or lifts heavy things.  This started about 4 years ago with after the birth of her fourth kid.  Initially this is all that happened but progressively,.  Symptoms worsened to also include sudden, intense urge to urinate but that this is less common than the urine loss with increase abdominal pressure.  Not temporality between the days symptoms and food consumption, no abdominal fullness, focal neurological deficits, including saddle anesthesia, or inability to get to the toilet.  No systemic or localized symptoms of infection or irritation.  Given the chronicity and risk factors for this patient, suspect of chronic disease processes like mixed incontinence, urge and stress.  She also reports some polydipsia; as such, will screen for diabetes given metabolic risk factors and use of prednisone .  -  Lab Results  Component Value Date   HGBA1C 5.5 12/21/2023  Within normal limits  -Discuss behavioral therapy and referral to OB/GYN for management of these and referral to pelvic floor PT

## 2023-12-21 NOTE — Patient Instructions (Addendum)
.  Thank you, Ms.Johni A Lamons for allowing us  to provide your care today. Today we discussed   Your urinary symptoms This is likely because your bladder is too responsive to pressure or is unable to relax. Please see the document attached.  - I have sent the referral to the GYN. Please call us  if you don't hear about the referral soon  Bone health - Pick up your vitamin D   Screening for diabetes - I will call you with the results  Hypertension Your blood pressure was elevated today. WE need to get this better controlled for  your health Please schedule an appointment in 2-4 weeks to address this specifically - BLOOD PRESSURE  Please check your blood pressure daily following the instructions below AND bring your readings to the next outpatient visit  Blood pressure tips   It is best to check your BP 1-2 hours after taking your medications to see the medications effectiveness on your BP.    Here are some tips that our clinical pharmacists share for home BP monitoring:          Rest 10 minutes before taking your blood pressure.          Don't smoke or drink caffeinated beverages for at least 30 minutes before.          Take your blood pressure before (not after) you eat.          Sit comfortably with your back supported and both feet on the floor (don't cross your legs).          Elevate your arm to heart level on a table or a desk.          Use the proper sized cuff. It should fit smoothly and snugly around your bare upper arm. There should be enough room to slip a fingertip under the cuff. The bottom edge of the cuff should be 1 inch above the crease of the elbow.     Take ALL your medications before you return to clinic   I have ordered the following labs for you:  Lab Orders         POC Hbg A1C      I will call if any are abnormal. All of your labs can be accessed through My Chart.   My Chart  Access: [email or web address]  Please follow-up in: 2 - 4 weeks for blood pressure. Take  your medications at that time    We look forward to seeing you next time. Please call our clinic at [phone] if you have any questions or concerns. The best time to call is Monday-Friday from 9am-4pm, but there is someone available 24/7. If after hours or the weekend, call the main hospital number and ask for the Internal Medicine Resident On-Call. If you need medication refills, please notify your pharmacy one week in advance and they will send us  a request.   Thank you for letting us  take part in your care. Wishing you the best!  Elnora Ip, MD 12/21/2023, 9:02 AM [facility]

## 2023-12-23 ENCOUNTER — Ambulatory Visit

## 2023-12-26 NOTE — Progress Notes (Signed)
 Internal Medicine Clinic Attending  Case discussed with the resident at the time of the visit.  We reviewed the resident's history and exam and pertinent patient test results.  I agree with the assessment, diagnosis, and plan of care documented in the resident's note.

## 2024-01-03 NOTE — Progress Notes (Signed)
 Office Visit Note  Patient: Jill Bright             Date of Birth: [birth year]           MRN: [id number]             PCP: Benuel Braun, DO Referring: Benuel Braun, DO Visit Date: 01/16/2024   Subjective:  Arthritis (Knot in elbow getting bigger.)   Discussed the use of AI scribe software for clinical note transcription with the patient, who gave verbal consent to proceed.  History of Present Illness   Lynniah A Koo is a 48 y.o. female here for follow up with seropositive rheumatoid arthritis on methotrexate  15 mg PO weekly and folic acid  1 mg daily.  She has been doing well on methotrexate , although she recently ran out of the medication and has just picked up a new supply. The medication is effective, as she only needed to take Tylenol  extra strength for some muscle discomfort, which resolved the issue. No noticeable side effects from methotrexate . She is currently taking six methotrexate  pills once a week.  She mentions swelling in her elbow and the presence of nodules, with one nodule having grown. The swelling is not particularly painful unless pressed, and she is now able to hold her elbow straight for the first time, although it remains slightly swollen. She experiences stiffness in the mornings, particularly in her wrist, which she cannot bend easily.  Previous lab tests showed normal kidney and liver function and blood counts, but a high sedimentation rate.   Previous HPI 12/02/2023 Tamey A Jeanise Durfey is a 48 year old female with seropositive rheumatoid arthritis who presents with worsening joint pain and swelling.   Since her last visit in April, she has experienced worsening symptoms of rheumatoid arthritis, with constant pain and swelling in her left elbow, which is accompanied by lumps. She describes sharp pains in her wrist and an inability to bend it, stating it feels like 'always a knot right here'.   Her right wrist has very limited mobility and  she is unable to bend it, with constant pain. She experiences constant pain and has difficulty with daily activities due to the limited movement in her elbow and wrist.   She was previously prescribed methotrexate  and prednisone . Methotrexate  was not taken consistently due to personal circumstances and lack of follow up, but when tried in June, it did not cause nausea or diarrhea. Prednisone  provided some pain relief, but she still required additional pain management with Advil .     Previous HPI 08/08/23 Tatijana A Valoria Tamburri is a 48 year old female with seropositive rheumatoid arthritis who presents with persistent joint swelling and pain.   She has been experiencing persistent swelling and pain in her elbow for the past month. The swelling is constant, particularly in the morning, and she has difficulty with movement, sometimes being unable to roll over.   She experiences significant morning stiffness, requiring her to wake up at 4 AM to manage her daily activities, including caring for her special needs daughter. Her elbow is constantly swollen and feels heavy. She has not been on any medication since January, when she completed a two-month course of prednisone  and a month of methotrexate  but stopped due to lack of follow up. She was previously on 10 mg of prednisone , which helped manage her symptoms. Currently, she takes over-the-counter Tylenol  Arthritis, 650 mg, two tablets in the morning and two at night, but it does not alleviate her  pain.   Her pain is severe, impacting her ability to perform daily tasks, such as cooking, where she wears a brace to manage the pain while chopping vegetables. She also reports feeling little lumps in her skin and describes her hand as warm and swollen.   She has a history of smoking, which may exacerbate her rheumatoid arthritis symptoms. Her daughter has advised her to reduce smoking, particularly marijuana, as it may worsen her condition.Her daughter has  been helping her with her daily activities due to her limited mobility.         Previous HPI 03/04/23 WAKISHA ALBERTS is a 48 y.o. female with a long-standing history of joint problems and chronic right ear postraumatic drainage, is here for evaluation of inflammatory arthritis. She reports a significant increase in symptoms over the past two years. They describe persistent joint swelling, pain, and loss of grip strength, which have been present for most of their life but have worsened since turning 45.  The patient's mother had mentioned that they were born with arthritis. However, the patient's symptoms have become more debilitating recently, affecting their ability to work and perform daily activities.  She had evaluation in internal medicine clinic including x-rays and lab tests these demonstrated erosive disease throughout the right wrist  and positive serology with CCP >250. She was started on prednisone  for joint inflammation with a good improvement. Despite taking prednisone , the patient continues to experience joint swelling and pain, but notes that the medication allows them to function and work, albeit with discomfort. Currently on 10 mg daily, decreasing to 5 makes symptoms to severe for her to work. The patient also reports needing to take ibuprofen  in addition to prednisone  to manage their pain, or has to take 4-6 when she was off the prednisone . They express concern about the long-term complications of steroid use. She does not experience palpable or painful lymph node swelling.  She does have a persistent nodule on the back of her neck that has been there for years. They have a history of smoking but quit seven years ago.     Labs reviewed ANA neg RF 25.7 CCP >250   Imaging reviewed 09/13/22 Xray right wrist IMPRESSION: 1. Severe radiocarpal, capitate-scaphoid, and capitate-lunate joint space narrowing with diffuse bone-on-bone contact, subchondral sclerosis, and cystic  changes. 2. Moderate distal radioulnar joint space narrowing.   Review of Systems  Constitutional:  Negative for fatigue.  HENT:  Negative for mouth sores and mouth dryness.   Eyes:  Negative for dryness.  Respiratory:  Negative for shortness of breath.   Cardiovascular:  Negative for chest pain and palpitations.  Gastrointestinal:  Negative for blood in stool, constipation and diarrhea.  Endocrine: Negative for increased urination.  Genitourinary:  Negative for involuntary urination.  Musculoskeletal:  Positive for joint pain, joint pain, joint swelling, myalgias, muscle weakness, morning stiffness, muscle tenderness and myalgias. Negative for gait problem.  Skin:  Negative for color change, rash, hair loss and sensitivity to sunlight.  Allergic/Immunologic: Negative for susceptible to infections.  Neurological:  Negative for dizziness and headaches.  Hematological:  Negative for swollen glands.  Psychiatric/Behavioral:  Negative for depressed mood and sleep disturbance. The patient is not nervous/anxious.     PMFS History:  Patient Active Problem List   Diagnosis Date Noted   Mixed incontinence urge and stress 12/21/2023   Healthcare maintenance 01/05/2023   Rheumatoid arthritis with rheumatoid factor of multiple sites without organ or systems involvement (HCC) 01/05/2023   Essential hypertension  09/17/2022   Corpus luteum cyst 01/30/2012    Past Medical History:  Diagnosis Date   Hypertension    Rheumatoid arthritis (HCC)    Vaginal Pap smear, abnormal    LEEP, ok since    Family History  Problem Relation Age of Onset   Diabetes Mother    Hypertension Mother    Lupus Sister    Past Surgical History:  Procedure Laterality Date   CESAREAN SECTION N/A 01/31/2016   Procedure: CESAREAN SECTION;  Surgeon: Gigi Botts, MD;  Location: [facility];  Service: Obstetrics;  Laterality: N/A;   CHOLECYSTECTOMY     DILATION AND CURETTAGE OF UTERUS     THERAPEUTIC ABORTION      Social History   Social History Narrative   Not on file   Immunization History  Administered Date(s) Administered   Influenza, Seasonal, Injecte, Preservative Fre 01/05/2023   Influenza,inj,Quad PF,6+ Mos 01/27/2016   Tdap 01/31/2016     Objective: Vital Signs: BP 130/86   Pulse 75   Temp (!) 96.5 F (35.8 C)   Resp 16   Ht 5' 6 (1.676 m)   Wt 198 lb (89.8 kg)   LMP 01/13/2024   BMI 31.96 kg/m    Physical Exam Eyes:     Conjunctiva/sclera: Conjunctivae normal.  Cardiovascular:     Rate and Rhythm: Normal rate and regular rhythm.  Pulmonary:     Effort: Pulmonary effort is normal.     Breath sounds: Normal breath sounds.  Musculoskeletal:     Comments: Nontender nodules on elbow and forearm extensor surface   Lymphadenopathy:     Cervical: No cervical adenopathy.  Skin:    General: Skin is warm and dry.  Neurological:     Mental Status: She is alert.  Psychiatric:        Mood and Affect: Mood normal.      Musculoskeletal Exam:  Shoulders full ROM no tenderness or swelling Left elbow swelling and tenderness to pressure Right wrist severely limited flexion and extension range of motion with pain, left wrist range of motion normal Fingers full ROM no tenderness or swelling Knees full ROM no tenderness or swelling   Investigation: No additional findings.  Imaging: No results found.  Recent Labs: Lab Results  Component Value Date   WBC 5.1 01/16/2024   HGB 11.5 (L) 01/16/2024   PLT 296 01/16/2024   NA 140 01/16/2024   K 4.2 01/16/2024   CL 107 01/16/2024   CO2 26 01/16/2024   GLUCOSE 73 01/16/2024   BUN 11 01/16/2024   CREATININE 0.81 01/16/2024   BILITOT 0.2 01/16/2024   ALKPHOS 85 01/05/2023   AST 13 01/16/2024   ALT 11 01/16/2024   PROT 6.4 01/16/2024   ALBUMIN 4.0 01/05/2023   CALCIUM  9.1 01/16/2024   GFRAA >60 05/19/2017   QFTBGOLDPLUS NEGATIVE 03/04/2023    Speciality Comments: No specialty comments available.  Procedures:  No  procedures performed Allergies: Tea   Assessment / Plan:     Visit Diagnoses: Rheumatoid arthritis with rheumatoid factor of multiple sites without organ or systems involvement (HCC) - Plan: methotrexate  (RHEUMATREX) 2.5 MG tablet, Sedimentation rate Persistent joint inflammation in elbows with nodules. Morning stiffness and swelling continue despite methotrexate . High sedimentation rate indicates inflammation. Normal kidney, liver, and blood counts. - Increase methotrexate  to 8 pills/week (20mg ). - Continue folic acid  1 mg daily - Advise splitting methotrexate  dose AM/PM if stomach irritation occurs. - Recheck sed rate for disease monitorint - Avoid prednisone  unless  significant flare-up. - Continue Tylenol  for arthritis pain relief.   High risk medication use - Plan: CBC with Differential/Platelet, Comprehensive metabolic panel with GFR Tolerating medication well with normal baseline labs but if not followed up for repeat yet.  No reported intolerance or side effect of methotrexate . -Checking CBC CMP for medication monitoring  Vitamin D  deficiency Continue vitamin D  supplementation  Orders: Orders Placed This Encounter  Procedures   Sedimentation rate   CBC with Differential/Platelet   Comprehensive metabolic panel with GFR   Meds ordered this encounter  Medications   methotrexate  (RHEUMATREX) 2.5 MG tablet    Sig: Take 8 tablets (20 mg total) by mouth once a week. Okay to split dose AM and PM as needed    Dispense:  104 tablet    Refill:  0     Follow-Up Instructions: Return in about 3 months (around 04/17/2024) for RA on MTX f/u 3mos.   Lonni LELON Ester, MD  Note - This record has been created using [employer].  Chart creation errors have been sought, but may not always  have been located. Such creation errors do not reflect on  the standard of medical care.

## 2024-01-04 ENCOUNTER — Ambulatory Visit: Admitting: Student

## 2024-01-04 NOTE — Progress Notes (Deleted)
 Elevated during this visit.  Previously on amlodipine  5 mg which she has not continued taking.  We did not have time to discuss this at length; however, patient denies headaches, changes in vision, chest pain or shortness of breath.   Discussed the need for tight blood pressure control.  Will restart amlodipine  at 10 mg daily and plan return to clinic within 1 month for blood pressure recheck and likely medication changes with BMP. - Amlodipine  10 mg daily   Flu shot Cervical cancer screening-referral to OB/GYN in place-02/24/2024 [facility] [address] [place] [phone] 2011  Colonoscopy?-Referred 01/2023, new referral? [facility] [address] [place] [address] [place],  [place]  [address] Main: [phone]    CC: Routine Follow Up for hypertension after last office visit 12/21/2023  HPI:  Jill Bright is a 48 y.o. female with pertinent PMH of hypertension, rheumatoid arthritis, and mixed urinary incontinence who presents as above. Please see assessment and plan below for further details.  Medications: Current Outpatient Medications  Medication Instructions   Acetaminophen  (TYLENOL  PO) Take by mouth.   amLODipine  (NORVASC ) 10 mg, Oral, Daily   Bismuth /Metronidaz/Tetracyclin 140-125-125 MG CAPS 3 capsules, Oral, 4 times daily   calcium  carbonate (TUMS - DOSED IN MG ELEMENTAL CALCIUM ) 500 MG chewable tablet 2 tablets, 3 times daily PRN   folic acid  (FOLVITE ) 1 mg, Oral, Daily   IBUPROFEN  PO Take by mouth.   methotrexate  (RHEUMATREX) 15 mg, Oral, Weekly, Caution:Chemotherapy. Protect from light.   naproxen  (NAPROSYN ) 500 mg, Oral, 2 times daily   pantoprazole  (PROTONIX ) 40 mg, Oral, 2 times daily   predniSONE  (DELTASONE ) 10 MG tablet Take 2 tablets (20 mg total) by mouth daily with breakfast for 7 days, THEN 1 tablet (10 mg total) daily with breakfast.   Vitamin D  (Ergocalciferol ) (DRISDOL ) 50,000 Units, Oral, Every 7 days      Review of Systems:   Pertinent items noted in HPI and/or A&P.  Physical Exam:  There were no vitals filed for this visit.  Constitutional:***. In no acute distress. HEENT: Normocephalic, atraumatic, Sclera non-icteric, PERRL, EOM intact Cardio:Regular rate and rhythm. 2+ bilateral {PulseLoc:[id number]} pulses. Pulm:Clear to auscultation bilaterally. Normal work of breathing on room air. Abdomen: Soft, non-tender, non-distended, positive bowel sounds. FDX:Wzhjupcz for extremity edema. Skin:Warm and dry. Neuro:Alert and oriented x3. No focal deficit noted. Psych:Pleasant mood and affect.   Assessment & Plan:   Assessment & Plan   No orders of the defined types were placed in this encounter.    Patient {GC/[id number]::discussed with,seen with} {JGIMTSattending2025/2026:[id number]}  Fairy Pool, DO [facility] Internal [facility] Resident PGY-3 Clinic Phone: [phone] Please contact the on call pager at [phone] for any urgent or emergent needs.

## 2024-01-10 ENCOUNTER — Ambulatory Visit

## 2024-01-16 ENCOUNTER — Encounter: Payer: Self-pay | Admitting: Internal Medicine

## 2024-01-16 ENCOUNTER — Ambulatory Visit: Attending: Internal Medicine | Admitting: Internal Medicine

## 2024-01-16 VITALS — BP 130/86 | HR 75 | Temp 96.5°F | Resp 16 | Ht 66.0 in | Wt 198.0 lb

## 2024-01-16 DIAGNOSIS — Z79899 Other long term (current) drug therapy: Secondary | ICD-10-CM | POA: Insufficient documentation

## 2024-01-16 DIAGNOSIS — E559 Vitamin D deficiency, unspecified: Secondary | ICD-10-CM | POA: Insufficient documentation

## 2024-01-16 DIAGNOSIS — M0579 Rheumatoid arthritis with rheumatoid factor of multiple sites without organ or systems involvement: Secondary | ICD-10-CM | POA: Insufficient documentation

## 2024-01-16 LAB — COMPREHENSIVE METABOLIC PANEL WITH GFR
AG Ratio: 1.5 (calc) (ref 1.0–2.5)
ALT: 11 U/L (ref 6–29)
AST: 13 U/L (ref 10–35)
Albumin: 3.8 g/dL (ref 3.6–5.1)
Alkaline phosphatase (APISO): 67 U/L (ref 31–125)
BUN: 11 mg/dL (ref 7–25)
CO2: 26 mmol/L (ref 20–32)
Calcium: 9.1 mg/dL (ref 8.6–10.2)
Chloride: 107 mmol/L (ref 98–110)
Creat: 0.81 mg/dL (ref 0.50–0.99)
Globulin: 2.6 g/dL (ref 1.9–3.7)
Glucose, Bld: 73 mg/dL (ref 65–99)
Potassium: 4.2 mmol/L (ref 3.5–5.3)
Sodium: 140 mmol/L (ref 135–146)
Total Bilirubin: 0.2 mg/dL (ref 0.2–1.2)
Total Protein: 6.4 g/dL (ref 6.1–8.1)
eGFR: 89 mL/min/1.73m2 (ref >=60)

## 2024-01-16 LAB — CBC WITH DIFFERENTIAL/PLATELET
Absolute Lymphocytes: 1739 {cells}/uL (ref 850–3900)
Absolute Monocytes: 316 {cells}/uL (ref 200–950)
Basophils Absolute: 20 {cells}/uL (ref 0–200)
Basophils Relative: 0.4 %
Eosinophils Absolute: 31 {cells}/uL (ref 15–500)
Eosinophils Relative: 0.6 %
HCT: 36.5 % (ref 35.0–45.0)
Hemoglobin: 11.5 g/dL — ABNORMAL LOW (ref 11.7–15.5)
MCH: 28.3 pg (ref 27.0–33.0)
MCHC: 31.5 g/dL — ABNORMAL LOW (ref 32.0–36.0)
MCV: 89.9 fL (ref 80.0–100.0)
MPV: 11.4 fL (ref 7.5–12.5)
Monocytes Relative: 6.2 %
Neutro Abs: 2994 {cells}/uL (ref 1500–7800)
Neutrophils Relative %: 58.7 %
Platelets: 296 Thousand/uL (ref 140–400)
RBC: 4.06 Million/uL (ref 3.80–5.10)
RDW: 13.2 % (ref 11.0–15.0)
Total Lymphocyte: 34.1 %
WBC: 5.1 Thousand/uL (ref 3.8–10.8)

## 2024-01-16 LAB — SEDIMENTATION RATE: Sed Rate: 14 mm/h (ref 0–20)

## 2024-01-16 MED ORDER — METHOTREXATE SODIUM 2.5 MG PO TABS: 20.0000 mg | ORAL_TABLET | ORAL | 0 refills | Status: AC

## 2024-01-24 ENCOUNTER — Other Ambulatory Visit: Payer: Self-pay | Admitting: Internal Medicine

## 2024-01-24 DIAGNOSIS — Z1231 Encounter for screening mammogram for malignant neoplasm of breast: Secondary | ICD-10-CM

## 2024-02-01 ENCOUNTER — Encounter

## 2024-02-01 DIAGNOSIS — Z1231 Encounter for screening mammogram for malignant neoplasm of breast: Secondary | ICD-10-CM

## 2024-02-14 ENCOUNTER — Ambulatory Visit: Admitting: Internal Medicine

## 2024-02-24 ENCOUNTER — Other Ambulatory Visit: Payer: Self-pay | Admitting: Internal Medicine

## 2024-02-24 ENCOUNTER — Encounter: Admitting: Obstetrics and Gynecology

## 2024-02-24 DIAGNOSIS — E559 Vitamin D deficiency, unspecified: Secondary | ICD-10-CM

## 2024-03-07 ENCOUNTER — Ambulatory Visit

## 2024-04-18 ENCOUNTER — Other Ambulatory Visit: Payer: Self-pay | Admitting: Obstetrics and Gynecology

## 2024-04-18 DIAGNOSIS — Z1231 Encounter for screening mammogram for malignant neoplasm of breast: Secondary | ICD-10-CM

## 2024-05-02 ENCOUNTER — Ambulatory Visit: Payer: Self-pay | Admitting: Student

## 2024-05-02 ENCOUNTER — Encounter: Admitting: Obstetrics and Gynecology

## 2024-05-02 ENCOUNTER — Ambulatory Visit

## 2024-05-02 DIAGNOSIS — Z1231 Encounter for screening mammogram for malignant neoplasm of breast: Secondary | ICD-10-CM

## 2024-05-02 NOTE — Progress Notes (Unsigned)
" ° °  CC: Follow-up appointment  HPI:  [name] is a 49 y.o. female with a past medical history of hypertension, rheumatoid arthritis, mixed incontinence who presents for follow up appointment.  Please see the assessment and plan for full HPI.  Medications: GERD: Protonix  40 mg BID, TUMS HTN: amlodipine  10 mg daily  RA: methotrexate  20 mg weekly, folic acid  1 mg daily Vitamin D  Deficiency: 50,000 units weekly  Pain: Tylenol  500 mg, ibuprofen , naproxen  500 BID  Last seen Rheumatology 01/16/2024 and at that time patient increased her methotrexate  to 30 mg weekly.   Last seen in [facility] on 12/21/23 and patient was seen for incontinence. Patient was given behavioral therapy options and patient was sent to OB/GYN.  Patient continued amlodipine  10 mg. Patient had elevated blood pressures because they were not taking their medications.   01/16/24 CMP: Crt 0.81 mg, Na 140, K 4.2, Bilirubin 0.2, AST 13, ALT 11 CBC: WBC 5.1, Hgb 11.5, Plt 296 ESR 14  A1c 5.5   Past Medical History:  Diagnosis Date   Hypertension    Rheumatoid arthritis (HCC)    Vaginal Pap smear, abnormal    LEEP, ok since    Current Medications[1]  Review of Systems:    Negative except for what is stated in HPI  Physical Exam:  There were no vitals filed for this visit.  General: Patient is sitting comfortably in the room  Head: Normocephalic, atraumatic  Cardio: Regular rate and rhythm, no murmurs, rubs or gallops Pulmonary: Clear to ausculation bilaterally with no rales, rhonchi, and crackles   Assessment & Plan:   Assessment & Plan      Patient discussed with Dr. {[id number]::Rylw,Ynqqfjw,Ryjfaopdd,Fjryzw,Tpoopjfd,Tpwqmzb}  Libby Blanch, DO Internal Medicine Resident PGY-3     [1]  Current Outpatient Medications:    Acetaminophen  (TYLENOL  PO), Take by mouth., Disp: , Rfl:    amLODipine  (NORVASC ) 10 MG tablet, Take 1 tablet (10 mg total) by mouth daily., Disp: 30 each, Rfl:  11   Bismuth /Metronidaz/Tetracyclin 140-125-125 MG CAPS, Take 3 capsules by mouth in the morning, at noon, in the evening, and at bedtime for 10 days. (Patient not taking: Reported on 01/16/2024), Disp: 120 capsule, Rfl: 0   calcium  carbonate (TUMS - DOSED IN MG ELEMENTAL CALCIUM ) 500 MG chewable tablet, Chew 2 tablets by mouth 3 (three) times daily as needed for indigestion or heartburn. (Patient not taking: Reported on 01/16/2024), Disp: , Rfl:    folic acid  (FOLVITE ) 1 MG tablet, Take 1 tablet (1 mg total) by mouth daily., Disp: 90 tablet, Rfl: 1   IBUPROFEN  PO, Take by mouth., Disp: , Rfl:    methotrexate  (RHEUMATREX) 2.5 MG tablet, Take 8 tablets (20 mg total) by mouth once a week. Okay to split dose AM and PM as needed, Disp: 104 tablet, Rfl: 0   naproxen  (NAPROSYN ) 500 MG tablet, Take 1 tablet (500 mg total) by mouth 2 (two) times daily. (Patient not taking: Reported on 01/16/2024), Disp: 30 tablet, Rfl: 0   pantoprazole  (PROTONIX ) 40 MG tablet, Take 1 tablet (40 mg total) by mouth 2 (two) times daily. (Patient not taking: Reported on 01/16/2024), Disp: 90 tablet, Rfl: 3   Vitamin D , Ergocalciferol , (DRISDOL ) 1.25 MG (50000 UNIT) CAPS capsule, Take 1 capsule (50,000 Units total) by mouth every 7 (seven) days., Disp: 12 capsule, Rfl: 0  "

## 2024-05-09 ENCOUNTER — Ambulatory Visit

## 2024-05-23 ENCOUNTER — Ambulatory Visit
